# Patient Record
Sex: Female | Born: 1960 | Race: White | Hispanic: No | Marital: Married | Smoking: Never smoker
Health system: Southern US, Community
[De-identification: ages and names within clinical notes are randomized; demographics above are authoritative.]

## PROBLEM LIST (undated history)

## (undated) DIAGNOSIS — I071 Rheumatic tricuspid insufficiency: Secondary | ICD-10-CM

## (undated) DIAGNOSIS — R002 Palpitations: Secondary | ICD-10-CM

## (undated) DIAGNOSIS — I499 Cardiac arrhythmia, unspecified: Secondary | ICD-10-CM

## (undated) DIAGNOSIS — E785 Hyperlipidemia, unspecified: Secondary | ICD-10-CM

## (undated) DIAGNOSIS — I340 Nonrheumatic mitral (valve) insufficiency: Secondary | ICD-10-CM

## (undated) HISTORY — PX: ESOPHAGOGASTRODUODENOSCOPY: SHX1529

## (undated) HISTORY — PX: CHOLECYSTECTOMY: SHX55

## (undated) HISTORY — PX: HERNIA REPAIR: SHX51

## (undated) HISTORY — PX: ABDOMINAL HYSTERECTOMY: SHX81

## (undated) HISTORY — PX: COLONOSCOPY: SHX174

---

## 2006-09-04 ENCOUNTER — Other Ambulatory Visit: Payer: Self-pay

## 2006-09-04 ENCOUNTER — Emergency Department: Payer: Self-pay | Admitting: Emergency Medicine

## 2006-10-26 ENCOUNTER — Ambulatory Visit: Payer: Self-pay | Admitting: Surgery

## 2008-02-18 ENCOUNTER — Emergency Department: Payer: Self-pay | Admitting: Emergency Medicine

## 2009-08-18 ENCOUNTER — Emergency Department: Payer: Self-pay | Admitting: Emergency Medicine

## 2009-12-21 ENCOUNTER — Ambulatory Visit: Payer: Self-pay | Admitting: Unknown Physician Specialty

## 2010-02-01 ENCOUNTER — Ambulatory Visit: Payer: Self-pay | Admitting: Unknown Physician Specialty

## 2010-02-04 LAB — PATHOLOGY REPORT

## 2010-02-28 ENCOUNTER — Ambulatory Visit: Payer: Self-pay | Admitting: Obstetrics and Gynecology

## 2010-03-19 ENCOUNTER — Ambulatory Visit: Payer: Self-pay | Admitting: Obstetrics and Gynecology

## 2014-02-28 DIAGNOSIS — I471 Supraventricular tachycardia: Secondary | ICD-10-CM | POA: Insufficient documentation

## 2014-02-28 DIAGNOSIS — E785 Hyperlipidemia, unspecified: Secondary | ICD-10-CM | POA: Insufficient documentation

## 2014-02-28 DIAGNOSIS — E782 Mixed hyperlipidemia: Secondary | ICD-10-CM | POA: Insufficient documentation

## 2014-02-28 DIAGNOSIS — R002 Palpitations: Secondary | ICD-10-CM | POA: Insufficient documentation

## 2014-02-28 DIAGNOSIS — R011 Cardiac murmur, unspecified: Secondary | ICD-10-CM | POA: Insufficient documentation

## 2014-03-02 DIAGNOSIS — R001 Bradycardia, unspecified: Secondary | ICD-10-CM | POA: Insufficient documentation

## 2014-05-15 ENCOUNTER — Ambulatory Visit: Payer: Self-pay | Admitting: Family Medicine

## 2014-12-18 ENCOUNTER — Other Ambulatory Visit: Payer: Self-pay | Admitting: Family Medicine

## 2014-12-19 NOTE — Telephone Encounter (Signed)
Patient was seen in February 2016 for neck pain and chest pain-xrays were ok. These medications were prescribed. She is requesting them again-does she need appt? I thought this was acute?

## 2015-07-10 DIAGNOSIS — Z800 Family history of malignant neoplasm of digestive organs: Secondary | ICD-10-CM | POA: Insufficient documentation

## 2015-07-12 ENCOUNTER — Ambulatory Visit (INDEPENDENT_AMBULATORY_CARE_PROVIDER_SITE_OTHER): Payer: BLUE CROSS/BLUE SHIELD | Admitting: Family Medicine

## 2015-07-12 ENCOUNTER — Encounter: Payer: Self-pay | Admitting: Family Medicine

## 2015-07-12 ENCOUNTER — Ambulatory Visit
Admission: RE | Admit: 2015-07-12 | Discharge: 2015-07-12 | Disposition: A | Discharge status: Home / Self Care | Payer: BLUE CROSS/BLUE SHIELD | Source: Ambulatory Visit | Attending: Family Medicine | Admitting: Family Medicine

## 2015-07-12 VITALS — BP 102/62 | HR 70 | Temp 98.1°F | Resp 16 | Ht 68.0 in | Wt 160.0 lb

## 2015-07-12 DIAGNOSIS — M5440 Lumbago with sciatica, unspecified side: Secondary | ICD-10-CM | POA: Insufficient documentation

## 2015-07-12 DIAGNOSIS — M5137 Other intervertebral disc degeneration, lumbosacral region: Secondary | ICD-10-CM | POA: Insufficient documentation

## 2015-07-12 DIAGNOSIS — M5136 Other intervertebral disc degeneration, lumbar region: Secondary | ICD-10-CM | POA: Diagnosis not present

## 2015-07-12 MED ORDER — PREDNISONE 10 MG (21) PO TBPK: 10.0000 mg | ORAL_TABLET | Freq: Every day | ORAL | Status: DC

## 2015-07-12 NOTE — Progress Notes (Signed)
Patient ID: Marcia Farmer, female   DOB: [birth year], 55 y.o.   MRN: [id number]       Patient: Marcia Farmer Female    DOB: [birth year]   55 y.o.   MRN: [id number] Visit Date: 07/12/2015  Today's Provider: [name], MD   Chief Complaint  Patient presents with  . Back Pain    for several months.    Subjective:    HPI Patient reports that she has had pain in her lower back for several months. Patient reports that her pain is becoming more frequent and it is starting to radiate to her left leg. She denies any injury. She also describes pain as a burning sensation. She reports that she experiences the back pain everyday. It can get more intense at different times during the day.    Allergies  Allergen Reactions  . Nitrofurantoin Rash   Previous Medications   CYCLOBENZAPRINE (FLEXERIL) 10 MG TABLET    TAKE ONE TABLET BY MOUTH AT BEDTIME AS NEEDED FOR PAIN   NAPROXEN (NAPROSYN) 500 MG TABLET    TAKE ONE TABLET BY MOUTH TWICE DAILY AS NEEDED FOR PAIN    Review of Systems  Constitutional: Negative.   Respiratory: Negative.   Musculoskeletal: Positive for myalgias and back pain. Negative for joint swelling, arthralgias, gait problem, neck pain and neck stiffness.  Neurological: Negative.     Social History  Substance Use Topics  . Smoking status: Never Smoker   . Smokeless tobacco: Not on file  . Alcohol Use: Not on file   Objective:   BP 102/62 mmHg  Pulse 70  Temp(Src) 98.1 F (36.7 C)  Resp 16  Ht 5\' 8"  (1.727 m)  Wt 160 lb (72.576 kg)  BMI 24.33 kg/m2  Physical Exam  Constitutional: She appears well-developed and well-nourished.  Musculoskeletal: She exhibits tenderness. She exhibits no edema.  Tender over S1-S2. Decreased sensation in left foot. Tender on left side of buttocks.   Left sciatic notch.      Assessment & Plan:     1. Midline low back pain with sciatica, sciatica laterality unspecified  - predniSONE (STERAPRED UNI-PAK 21 TAB)  10 MG (21) TBPK tablet; Take 1 tablet (10 mg total) by mouth daily. Take 6 tablets X 1 day, 5 tablet X 1 day, 4 tablets X 1 day, 3 tablets X 1 day, 2 tablets X 1 day, 1 tablet X 1 day.  Dispense: 21 tablet; Refill: 0 - DG Lumbar Spine Complete; Future       Kenji Mapel Cranford Mon, MD  [facility]

## 2015-07-13 ENCOUNTER — Telehealth: Payer: Self-pay

## 2015-07-13 NOTE — Telephone Encounter (Signed)
Advised patient of results.  

## 2015-07-13 NOTE — Telephone Encounter (Signed)
Pt returned call. Thanks TNP °

## 2015-07-13 NOTE — Telephone Encounter (Signed)
Tried calling patient and no answer. Unable to leave VM due to mailbox being full. Will try again later.

## 2015-07-13 NOTE — Telephone Encounter (Signed)
-----   Message from Jerrol Banana., MD sent at 07/12/2015  4:01 PM EDT ----- Arthritis and DDD present

## 2015-07-25 ENCOUNTER — Encounter: Payer: Self-pay | Admitting: Family Medicine

## 2015-07-25 ENCOUNTER — Ambulatory Visit (INDEPENDENT_AMBULATORY_CARE_PROVIDER_SITE_OTHER): Payer: BLUE CROSS/BLUE SHIELD | Admitting: Family Medicine

## 2015-07-25 VITALS — BP 102/60 | Temp 98.0°F | Resp 16 | Wt 158.0 lb

## 2015-07-25 DIAGNOSIS — M549 Dorsalgia, unspecified: Secondary | ICD-10-CM

## 2015-07-25 NOTE — Progress Notes (Signed)
Patient ID: Marcia Farmer, female   DOB: [birth year], 55 y.o.   MRN: [id number]       Patient: Marcia Farmer Female    DOB: 1960-05-08   55 y.o.   MRN: [id number] Visit Date: 07/25/2015  Today's Provider: [name], MD   Chief Complaint  Patient presents with  . Back Pain    2 week FU   Subjective:    HPI Patient comes in today for a follow up on her back pain. Patient was started on prednisone and reports that her back pain has improved. Her xray showed degenerative disk disease in the areas in which she was experiencing pain.     Allergies  Allergen Reactions  . Nitrofurantoin Rash   Previous Medications   CYCLOBENZAPRINE (FLEXERIL) 10 MG TABLET    TAKE ONE TABLET BY MOUTH AT BEDTIME AS NEEDED FOR PAIN   NAPROXEN (NAPROSYN) 500 MG TABLET    TAKE ONE TABLET BY MOUTH TWICE DAILY AS NEEDED FOR PAIN   PREDNISONE (STERAPRED UNI-PAK 21 TAB) 10 MG (21) TBPK TABLET    Take 1 tablet (10 mg total) by mouth daily. Take 6 tablets X 1 day, 5 tablet X 1 day, 4 tablets X 1 day, 3 tablets X 1 day, 2 tablets X 1 day, 1 tablet X 1 day.    Review of Systems  Constitutional: Negative.   HENT: Negative.   Respiratory: Negative.   Endocrine: Negative.   Musculoskeletal: Positive for back pain. Negative for myalgias, joint swelling, arthralgias, gait problem and neck pain.  Allergic/Immunologic: Negative.   Neurological: Negative.     Social History  Substance Use Topics  . Smoking status: Never Smoker   . Smokeless tobacco: Not on file  . Alcohol Use: 0.0 oz/week    0 Standard drinks or equivalent per week     Comment: 7-9oz of wine daily   Objective:   BP 102/60 mmHg  Temp(Src) 98 F (36.7 C)  Resp 16  Wt 158 lb (71.668 kg)  Physical Exam  Constitutional: She is oriented to person, place, and time. She appears well-developed and well-nourished.  HENT:  Head: Normocephalic and atraumatic.  Right Ear: External ear normal.  Left Ear: External ear normal.    Nose: Nose normal.  Eyes: Conjunctivae are normal.  Neck: Thyromegaly present.  Cardiovascular: Normal rate, regular rhythm and normal heart sounds.   Pulmonary/Chest: Effort normal and breath sounds normal.  Abdominal: Soft.  Musculoskeletal: Normal range of motion.  Neurological: She is alert and oriented to person, place, and time. She displays normal reflexes. No cranial nerve deficit. She exhibits normal muscle tone. Coordination normal.  Skin: Skin is warm and dry.  Psychiatric: She has a normal mood and affect. Her behavior is normal. Judgment and thought content normal.        Assessment & Plan:     1. Mid back pain Improved. Will refer to physical therapy to help improve back pain.  - Ambulatory referral to Physical Therapy  2. Adenomatous polyps of the colon  I have not seen the report but it must be high-grade dysplasia. This is to be repeated in the next few months with colonoscopy.       Tayna Smethurst Cranford Mon, MD  [facility]

## 2015-08-21 ENCOUNTER — Ambulatory Visit: Payer: BLUE CROSS/BLUE SHIELD | Admitting: Physical Therapy

## 2015-12-11 ENCOUNTER — Emergency Department: Payer: BLUE CROSS/BLUE SHIELD

## 2015-12-11 ENCOUNTER — Telehealth: Payer: Self-pay

## 2015-12-11 ENCOUNTER — Encounter: Payer: Self-pay | Admitting: Medical Oncology

## 2015-12-11 ENCOUNTER — Emergency Department
Admission: EM | Admit: 2015-12-11 | Discharge: 2015-12-11 | Disposition: A | Discharge status: Home / Self Care | Payer: BLUE CROSS/BLUE SHIELD | Attending: Emergency Medicine | Admitting: Emergency Medicine

## 2015-12-11 DIAGNOSIS — Z791 Long term (current) use of non-steroidal anti-inflammatories (NSAID): Secondary | ICD-10-CM | POA: Diagnosis not present

## 2015-12-11 DIAGNOSIS — R002 Palpitations: Secondary | ICD-10-CM | POA: Insufficient documentation

## 2015-12-11 LAB — BASIC METABOLIC PANEL
ANION GAP: 5 (ref 5–15)
BUN: 14 mg/dL (ref 6–20)
CHLORIDE: 100 mmol/L — AB (ref 101–111)
CO2: 31 mmol/L (ref 22–32)
Calcium: 9.8 mg/dL (ref 8.9–10.3)
Creatinine, Ser: 0.82 mg/dL (ref 0.44–1.00)
GFR calc Af Amer: >60 mL/min (ref >60)
GFR calc non Af Amer: >60 mL/min (ref >60)
GLUCOSE: 103 mg/dL — AB (ref 65–99)
POTASSIUM: 4.2 mmol/L (ref 3.5–5.1)
Sodium: 136 mmol/L (ref 135–145)

## 2015-12-11 LAB — URINALYSIS COMPLETE WITH MICROSCOPIC (ARMC ONLY)
BACTERIA UA: NONE SEEN
Bilirubin Urine: NEGATIVE
Glucose, UA: NEGATIVE mg/dL
Hgb urine dipstick: NEGATIVE
Ketones, ur: NEGATIVE mg/dL
Leukocytes, UA: NEGATIVE
NITRITE: NEGATIVE
PH: 8 (ref 5.0–8.0)
PROTEIN: NEGATIVE mg/dL
SPECIFIC GRAVITY, URINE: 1.011 (ref 1.005–1.030)

## 2015-12-11 LAB — CBC
HEMATOCRIT: 41.5 % (ref 35.0–47.0)
HEMOGLOBIN: 14.8 g/dL (ref 12.0–16.0)
MCH: 31.8 pg (ref 26.0–34.0)
MCHC: 35.7 g/dL (ref 32.0–36.0)
MCV: 89.1 fL (ref 80.0–100.0)
Platelets: 251 10*3/uL (ref 150–440)
RBC: 4.66 MIL/uL (ref 3.80–5.20)
RDW: 12.6 % (ref 11.5–14.5)
WBC: 6.7 10*3/uL (ref 3.6–11.0)

## 2015-12-11 LAB — TSH: TSH: 2.091 u[IU]/mL (ref 0.350–4.500)

## 2015-12-11 LAB — TROPONIN I: Troponin I: <0.03 ng/mL (ref <0.03)

## 2015-12-11 NOTE — ED Provider Notes (Signed)
[facility] Emergency Department Provider Note  ____________________________________________  Time seen: Approximately 3:10 PM  I have reviewed the triage vital signs and the nursing notes.   HISTORY  Chief Complaint Tachycardia and Anxiety   HPI Marcia Farmer is a 55 y.o. female a history of paroxysmal supraventricular tachycardia who presents for evaluation of palpitations. Patient reports that she usually had an episode of symptomatic palpitations once a month. She was initially seen by Dr. Madilyn Hook 2015 with a negative stress test and a Holter monitor that showed atrial tachycardia. The patient is not on any medications. She reports that over the course of the last 2 weeks she has been having these episodes for most of the day every day. She reports that sometimes they get so intense that she feels like she is going to pass out. She has no chest pain, no shortness of breath, no diaphoresis, no nausea or vomiting associated with this pain. She is not a smoker. She has family history of ischemic Farmer disease in her paternal side. She denies any personal or family history of blood clots, recent travel or immobilization, leg pain or swelling, exogenous hormones, hemoptysis. She drinks 2 cups of caffeine a day and one glass of wine a day. She denies drug use. Currently her symptoms are very mild and she describes them as a fluttering butterfly sensation on her chest  History reviewed. No pertinent past medical history.  Patient Active Problem List   Diagnosis Date Noted  . Family history of colon cancer 07/10/2015  . Bradycardia 03/02/2014  . Cardiac murmur 02/28/2014  . Combined fat and carbohydrate induced hyperlipemia 02/28/2014  . Paroxysmal supraventricular tachycardia ([place]) 02/28/2014  . Cholelithiasis and cholecystitis without obstruction 10/05/2006    Past Surgical History:  Procedure Laterality Date  . ABDOMINAL HYSTERECTOMY    .  CHOLECYSTECTOMY      Prior to Admission medications   Medication Sig Start Date End Date Taking? Authorizing Provider  cyclobenzaprine (FLEXERIL) 10 MG tablet TAKE ONE TABLET BY MOUTH AT BEDTIME AS NEEDED FOR PAIN 12/20/14   Jerrol Banana., MD  naproxen (NAPROSYN) 500 MG tablet TAKE ONE TABLET BY MOUTH TWICE DAILY AS NEEDED FOR PAIN 12/20/14   Jerrol Banana., MD  predniSONE (STERAPRED UNI-PAK 21 TAB) 10 MG (21) TBPK tablet Take 1 tablet (10 mg total) by mouth daily. Take 6 tablets X 1 day, 5 tablet X 1 day, 4 tablets X 1 day, 3 tablets X 1 day, 2 tablets X 1 day, 1 tablet X 1 day. Patient not taking: Reported on 07/25/2015 07/12/15   Jerrol Banana., MD    Allergies Nitrofurantoin  Family History  Problem Relation Age of Onset  . Farmer disease Mother   . Diabetes Father   . Farmer disease Father   . Colon cancer Father     Social History Social History  Substance Use Topics  . Smoking status: Never Smoker  . Smokeless tobacco: Not on file  . Alcohol use 0.0 oz/week     Comment: 7-9oz of wine daily    Review of Systems Constitutional: Negative for fever. Eyes: Negative for visual changes. ENT: Negative for sore throat. Cardiovascular: Negative for chest pain. + palpitations Respiratory: Negative for shortness of breath. Gastrointestinal: Negative for abdominal pain, vomiting or diarrhea. Genitourinary: Negative for dysuria. Musculoskeletal: Negative for back pain. Skin: Negative for rash. Neurological: Negative for headaches, weakness or numbness.  ____________________________________________   PHYSICAL EXAM:  VITAL SIGNS: ED Triage  Vitals [12/11/15 1322]  Enc Vitals Group     BP (!) 152/85     Pulse Rate 76     Resp 18     Temp 98.1 F (36.7 C)     Temp Source Oral     SpO2 100 %     Weight 155 lb (70.3 kg)     Height 5\' 8"  (1.727 m)     Head Circumference      Peak Flow      Pain Score      Pain Loc      Pain Edu?      Excl. in [place]?      Constitutional: Alert and oriented. Well appearing and in no apparent distress. HEENT:      Head: Normocephalic and atraumatic.         Eyes: Conjunctivae are normal. Sclera is non-icteric. EOMI. PERRL      Mouth/Throat: Mucous membranes are moist.       Neck: Supple with no signs of meningismus. Cardiovascular: Regular rate and rhythm. No murmurs, gallops, or rubs. 2+ symmetrical distal pulses are present in all extremities. No JVD. Respiratory: Normal respiratory effort. Lungs are clear to auscultation bilaterally. No wheezes, crackles, or rhonchi.  Gastrointestinal: Soft, non tender, and non distended with positive bowel sounds. No rebound or guarding. Genitourinary: No CVA tenderness. Musculoskeletal: Nontender with normal range of motion in all extremities. No edema, cyanosis, or erythema of extremities. Neurologic: Normal speech and language. Face is symmetric. Moving all extremities. No gross focal neurologic deficits are appreciated. Skin: Skin is warm, dry and intact. No rash noted. Psychiatric: Mood and affect are normal. Speech and behavior are normal.  ____________________________________________   LABS (all labs ordered are listed, but only abnormal results are displayed)  Labs Reviewed  BASIC METABOLIC PANEL - Abnormal; Notable for the following:       Result Value   Chloride 100 (*)    Glucose, Bld 103 (*)    All other components within normal limits  URINALYSIS COMPLETEWITH MICROSCOPIC ([facility] ONLY) - Abnormal; Notable for the following:    Color, Urine STRAW (*)    APPearance CLEAR (*)    Squamous Epithelial / LPF 0-5 (*)    All other components within normal limits  CBC  TROPONIN I  TSH  TROPONIN I  CBG MONITORING, ED   ____________________________________________  EKG  ED ECG REPORT I, Rudene Re, the attending physician, personally viewed and interpreted this ECG. Normal sinus rhythm, rate of 81, normal intervals, normal axis, no ST  elevations or depressions. Unchanged from prior ____________________________________________  RADIOLOGY  CXR: Negative ____________________________________________   PROCEDURES  Procedure(s) performed: None Procedures Critical Care performed:  None ____________________________________________   INITIAL IMPRESSION / ASSESSMENT AND PLAN / ED COURSE  56 y.o. female a history of paroxysmal supraventricular tachycardia who presents for evaluation of palpitations. EKG with no evidence of SVT. Physical exam was within normal limits. Patient had Holter monitor in 2015 which showed atrial tachycardia. Will check TSH, CBC, BMP, troponin as patient has fh of ischemic Farmer disease. Will obs on telemetry. Will discuss patient with Dr. Nehemiah Massed.  Clinical Course  Comment By Time  Spoke with Dr. Saralyn Pilar who was able to look at the results of patient's holter monitor from 2015 which shoed no signs of arrhythmia. He recommended against starting patient on BB at this time and have patient see Dr. Nehemiah Massed this week for close follow up. 2nd troponin is due now. Rudene Re, MD 09/26  1629  Patient watched on telemetry with occasional PVCs but no evidence of arrhythmia. Blood work within normal limits including troponin 2. We'll discharge patient at this time with close follow-up with Dr. Nehemiah Massed. I discussed return precautions with patient and her husband were both comfortable with the plan. Rudene Re, MD 09/26 1751    Pertinent labs & imaging results that were available during my care of the patient were reviewed by me and considered in my medical decision making (see chart for details).    ____________________________________________   FINAL CLINICAL IMPRESSION(S) / ED DIAGNOSES  Final diagnoses:  Palpitations      NEW MEDICATIONS STARTED DURING THIS VISIT:  Discharge Medication List as of 12/11/2015  5:52 PM       Note:  This document was prepared using Dragon voice  recognition software and may include unintentional dictation errors.    Rudene Re, MD 12/11/15 2011

## 2015-12-11 NOTE — ED Triage Notes (Signed)
Pt reports for the past 2 weeks she has been feeling anxious and tearful for no reason. Pt reports that she feels her heart racing right now, denies chest pain but feels light headed.

## 2015-12-11 NOTE — Telephone Encounter (Signed)
Patient called office very tearful on phone stating that she has been more aware of her heart beat over the past several days. Patient states the irregularity is constant and has got worse over the past few days. Patient denied shortness of breath, increased stress or anxiety, dizziness, fever, URI symptoms or paresthesia. Patient reports that she has been feeling light headed over the past several days and today it has increased. Patient was sitting down when I was speaking to her on phone, she states that she has a history of double heart beat. I spoke with phone team who stated that neither Dr. Rosanna Randy or P.A's had openings this evening. I instructed patient that it would be best that she seek immediate attention rather than to prolong and wait, I advised patient to go to Emergency room. Patient seemed to be compliant with seeking immediate medical attention. Patient would like nurse to give her a call back so she can give a update in regards to her condition. Patient can be reached at [phone].KW

## 2015-12-11 NOTE — ED Notes (Addendum)
Pt reports that she is feeling better. Denies pain. Denies fluttering in chest.

## 2015-12-12 ENCOUNTER — Ambulatory Visit: Payer: BLUE CROSS/BLUE SHIELD | Admitting: Family Medicine

## 2015-12-12 DIAGNOSIS — Z800 Family history of malignant neoplasm of digestive organs: Secondary | ICD-10-CM | POA: Diagnosis not present

## 2015-12-12 DIAGNOSIS — E782 Mixed hyperlipidemia: Secondary | ICD-10-CM | POA: Diagnosis not present

## 2015-12-12 DIAGNOSIS — R002 Palpitations: Secondary | ICD-10-CM | POA: Diagnosis not present

## 2015-12-12 DIAGNOSIS — Z86010 Personal history of colonic polyps: Secondary | ICD-10-CM | POA: Diagnosis not present

## 2015-12-12 DIAGNOSIS — I471 Supraventricular tachycardia: Secondary | ICD-10-CM | POA: Diagnosis not present

## 2015-12-12 NOTE — Telephone Encounter (Signed)
Pt has appt still on the schedule for 11:15 am will wait and see if she comes in.-aa

## 2015-12-13 NOTE — Telephone Encounter (Signed)
FYI  Spoke with patient. She went to ER and has followed up with Dr Nehemiah Massed yesterday September 27th, she is wearing a holter monitor and is set up for echocardiogram on October 6th. Advised patient if she needs anything from [place] to let us know.-aa

## 2015-12-19 ENCOUNTER — Encounter: Payer: Self-pay | Admitting: Family Medicine

## 2015-12-19 ENCOUNTER — Ambulatory Visit (INDEPENDENT_AMBULATORY_CARE_PROVIDER_SITE_OTHER): Payer: BLUE CROSS/BLUE SHIELD | Admitting: Family Medicine

## 2015-12-19 VITALS — BP 126/72 | HR 90 | Temp 97.9°F | Resp 18 | Wt 163.0 lb

## 2015-12-19 DIAGNOSIS — F419 Anxiety disorder, unspecified: Secondary | ICD-10-CM

## 2015-12-19 DIAGNOSIS — R002 Palpitations: Secondary | ICD-10-CM | POA: Diagnosis not present

## 2015-12-19 MED ORDER — ALPRAZOLAM 0.25 MG PO TABS: 0.2500 mg | ORAL_TABLET | Freq: Three times a day (TID) | ORAL | 5 refills | Status: AC | PRN

## 2015-12-19 MED ORDER — SERTRALINE HCL 50 MG PO TABS: 50.0000 mg | ORAL_TABLET | Freq: Every day | ORAL | 3 refills | Status: DC

## 2015-12-19 NOTE — Progress Notes (Signed)
Marcia Farmer  MRN: [id number] DOB: Jan 04, 1961  Subjective:  HPI   The patient is a 55 year old female who presents after being seen in the ED for elevated heart rate.  She has had a 24 hour holter done and Dr Salena Saner is seeing her to do Echo on Friday 06, 2017. The patient was not treated with any kind of medication but feels she may need something to calm her down. The patient expressed that she is overwhelmed with work and family responsibilities.  She states she is not depressed and does not want to be on an antidepressant.   Patient Active Problem List   Diagnosis Date Noted  . Family history of colon cancer 07/10/2015  . Bradycardia 03/02/2014  . Cardiac murmur 02/28/2014  . Combined fat and carbohydrate induced hyperlipemia 02/28/2014  . Paroxysmal supraventricular tachycardia ([place]) 02/28/2014  . Cholelithiasis and cholecystitis without obstruction 10/05/2006    No past medical history on file.  Social History   Social History  . Marital status: Married    Spouse name: N/A  . Number of children: N/A  . Years of education: N/A   Occupational History  . Not on file.   Social History Main Topics  . Smoking status: Never Smoker  . Smokeless tobacco: Not on file  . Alcohol use 0.0 oz/week     Comment: 7-9oz of wine daily  . Drug use: No  . Sexual activity: Not on file   Other Topics Concern  . Not on file   Social History Narrative  . No narrative on file    Outpatient Encounter Prescriptions as of 12/19/2015  Medication Sig  . cyclobenzaprine (FLEXERIL) 10 MG tablet TAKE ONE TABLET BY MOUTH AT BEDTIME AS NEEDED FOR PAIN  . naproxen (NAPROSYN) 500 MG tablet TAKE ONE TABLET BY MOUTH TWICE DAILY AS NEEDED FOR PAIN  . [DISCONTINUED] predniSONE (STERAPRED UNI-PAK 21 TAB) 10 MG (21) TBPK tablet Take 1 tablet (10 mg total) by mouth daily. Take 6 tablets X 1 day, 5 tablet X 1 day, 4 tablets X 1 day, 3 tablets X 1 day, 2 tablets X 1 day, 1 tablet X 1 day.  (Patient not taking: Reported on 07/25/2015)   No facility-administered encounter medications on file as of 12/19/2015.     Allergies  Allergen Reactions  . Nitrofurantoin Rash    Review of Systems  Constitutional: Negative for fever and malaise/fatigue.  Respiratory: Negative for cough, shortness of breath and wheezing.   Cardiovascular: Negative for chest pain, palpitations, orthopnea, claudication, leg swelling and PND.  Neurological: Positive for dizziness. Negative for weakness and headaches.  Psychiatric/Behavioral: Positive for memory loss. Negative for depression, hallucinations, substance abuse and suicidal ideas. The patient is nervous/anxious. The patient does not have insomnia.    Objective:  BP 126/72   Pulse 90   Temp 97.9 F (36.6 C) (Oral)   Resp 18   Wt 163 lb (73.9 kg)   BMI 24.78 kg/m   Physical Exam  Constitutional: She is well-developed, well-nourished, and in no distress.  HENT:  Head: Normocephalic and atraumatic.  Eyes: Pupils are equal, round, and reactive to light.  Neck: Normal range of motion.  Cardiovascular: Normal rate, regular rhythm and normal heart sounds.   Pulmonary/Chest: Effort normal and breath sounds normal.    Assessment and Plan :   1. Acute anxiety/chronic PHQ9 on Next visit as there is some superimposed mild depression. She is certainly not suicidal or homicidal. - sertraline (ZOLOFT) 50 MG  tablet; Take 1 tablet (50 mg total) by mouth daily.  Dispense: 30 tablet; Refill: 3 - ALPRAZolam (XANAX) 0.25 MG tablet; Take 1 tablet (0.25 mg total) by mouth every 8 (eight) hours as needed for anxiety.  Dispense: 90 tablet; Refill: 5 I have done the exam and reviewed the chart and it is accurate to the best of my knowledge. Miguel Aschoff M.D. [facility]    HPI, Exam and A&P Transcribed under the direction and in the presence of [name]., MD. Electronically Signed: Althea Charon,  [place]

## 2015-12-21 DIAGNOSIS — I471 Supraventricular tachycardia: Secondary | ICD-10-CM | POA: Diagnosis not present

## 2016-01-08 ENCOUNTER — Other Ambulatory Visit: Payer: Self-pay | Admitting: Family Medicine

## 2016-01-09 NOTE — Telephone Encounter (Signed)
LOV 12/19/2015. Emily Drozdowski, CMA  

## 2016-01-10 DIAGNOSIS — I493 Ventricular premature depolarization: Secondary | ICD-10-CM | POA: Diagnosis not present

## 2016-01-23 ENCOUNTER — Encounter: Payer: Self-pay | Admitting: Family Medicine

## 2016-01-23 ENCOUNTER — Ambulatory Visit (INDEPENDENT_AMBULATORY_CARE_PROVIDER_SITE_OTHER): Payer: BLUE CROSS/BLUE SHIELD | Admitting: Family Medicine

## 2016-01-23 VITALS — BP 118/64 | HR 84 | Temp 97.9°F | Resp 16 | Ht 68.0 in | Wt 168.4 lb

## 2016-01-23 DIAGNOSIS — Z0000 Encounter for general adult medical examination without abnormal findings: Secondary | ICD-10-CM

## 2016-01-23 DIAGNOSIS — E782 Mixed hyperlipidemia: Secondary | ICD-10-CM | POA: Diagnosis not present

## 2016-01-23 LAB — POCT URINALYSIS DIPSTICK
BILIRUBIN UA: NEGATIVE
GLUCOSE UA: NEGATIVE
KETONES UA: NEGATIVE
Leukocytes, UA: NEGATIVE
NITRITE UA: NEGATIVE
PH UA: 6.5
Protein, UA: NEGATIVE
RBC UA: NEGATIVE
SPEC GRAV UA: 1.02
Urobilinogen, UA: 0.2

## 2016-01-23 NOTE — Progress Notes (Signed)
Patient: Marcia Farmer, Female    DOB: [birth year], 55 y.o.   MRN: [id number] Visit Date: 01/23/2016  Today's Provider: [name], MD   Chief Complaint  Patient presents with  . Annual Exam   Subjective:    Annual physical exam Marcia Farmer is a 55 y.o. female who presents today for health maintenance and complete physical. She feels fairly well. She reports exercising slightly. She reports she is sleeping well.  ----------------------------------------------------------------- Colonoscopy: 02/27/2015.Patient is scheduled for a repeat in December Pap: [facility] Mammo: [facility] in 2016  Review of Systems  Constitutional: Negative.   HENT: Negative.   Eyes: Negative.   Respiratory: Negative.   Cardiovascular: Negative.   Gastrointestinal: Negative.   Endocrine: Negative.   Genitourinary: Negative.   Musculoskeletal: Positive for arthralgias and back pain.  Skin: Negative.   Allergic/Immunologic: Negative.   Neurological: Negative.   Hematological: Negative.   Psychiatric/Behavioral: Negative.     Social History      She  reports that she has never smoked. She does not have any smokeless tobacco history on file. She reports that she drinks about 6.0 - 8.4 oz of alcohol per week . She reports that she does not use drugs.       Social History   Social History  . Marital status: Married    Spouse name: N/A  . Number of children: N/A  . Years of education: N/A   Social History Main Topics  . Smoking status: Never Smoker  . Smokeless tobacco: None  . Alcohol use 6.0 - 8.4 oz/week    10 - 14 Glasses of wine per week     Comment: 7-9oz of wine daily  . Drug use: No  . Sexual activity: Not Asked   Other Topics Concern  . None   Social History Narrative  . None    No past medical history on file.   Patient Active Problem List   Diagnosis Date Noted  . 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 Family history of colon cancer  07/10/2015  . Bradycardia 03/02/2014  . Cardiac murmur 02/28/2014  . Combined fat and carbohydrate induced hyperlipemia 02/28/2014  . Paroxysmal supraventricular tachycardia ([place]) 02/28/2014  . Cholelithiasis and cholecystitis without obstruction 10/05/2006    Past Surgical History:  Procedure Laterality Date  . ABDOMINAL HYSTERECTOMY    . CHOLECYSTECTOMY      Family History        Family Status  Relation Status  . Mother Alive  . Father Deceased at age 19  . Sister Alive        Her family history includes Colon cancer in her father; Diabetes in her father; Heart disease in her father and mother.     Allergies  Allergen Reactions  . Nitrofurantoin Rash     Current Outpatient Prescriptions:  .  ALPRAZolam (XANAX) 0.25 MG tablet, Take 1 tablet (0.25 mg total) by mouth every 8 (eight) hours as needed for anxiety., Disp: 90 tablet, Rfl: 5 .  cyclobenzaprine (FLEXERIL) 10 MG tablet, TAKE ONE TABLET BY MOUTH AT BEDTIME AS NEEDED FOR PAIN, Disp: 30 tablet, Rfl: 5 .  naproxen (NAPROSYN) 500 MG tablet, TAKE ONE TABLET BY MOUTH TWICE DAILY AS NEEDED FOR PAIN, Disp: 60 tablet, Rfl: 0 .  sertraline (ZOLOFT) 50 MG tablet, Take 1 tablet (50 mg total) by mouth daily. (Patient not taking: Reported on 01/23/2016), Disp: 30 tablet, Rfl: 3   Patient Care Team: Eulas Post  Brooke Bonito., MD as PCP - General (Family Medicine)      Objective:   Vitals: BP 118/64 (BP Location: Right Arm, Patient Position: Sitting, Cuff Size: Normal)   Pulse 84   Temp 97.9 F (36.6 C) (Oral)   Resp 16   Ht 5\' 8"  (1.727 m)   Wt 168 lb 6.4 oz (76.4 kg)   BMI 25.61 kg/m    Physical Exam  Constitutional: She is oriented to person, place, and time. She appears well-developed and well-nourished.  HENT:  Head: Normocephalic and atraumatic.  Right Ear: External ear normal.  Left Ear: External ear normal.  Mouth/Throat: Oropharynx is clear and moist.  Eyes: Conjunctivae and EOM are normal. Pupils are equal,  round, and reactive to light.  Neck: Normal range of motion. Neck supple.  Cardiovascular: Normal rate, regular rhythm, normal heart sounds and intact distal pulses.   Pulmonary/Chest: Effort normal and breath sounds normal.  Abdominal: Soft. Bowel sounds are normal.  Musculoskeletal: Normal range of motion.  Neurological: She is alert and oriented to person, place, and time. She has normal reflexes.  Skin: Skin is warm and dry.  Psychiatric: She has a normal mood and affect. Her behavior is normal. Judgment and thought content normal.     Depression Screen No flowsheet data found.    Assessment & Plan:     Routine Health Maintenance and Physical Exam  Exercise Activities and Dietary recommendations Goals    None       There is no immunization history on file for this patient.  Health Maintenance  Topic Date Due  . Hepatitis C Screening  09/25/1962  . HIV Screening  01/12/1978  . TETANUS/TDAP  01/12/1982  . PAP SMEAR  01/13/1984  . MAMMOGRAM  01/12/2013  . INFLUENZA VACCINE  01/22/2017 (Originally 10/16/2015)  . COLONOSCOPY  02/26/2025   [name]. Breast Pap and rectal per gynecology. Discussed health benefits of physical activity, and encouraged her to engage in regular exercise appropriate for her age and condition.  I will see her back by the end of the year in follow-up for adjustment reaction.   --------------------------------------------------------------------   I have done the exam and reviewed the above chart and it is accurate to the best of my knowledge. Development worker, community has been used in this note in any air is in the dictation or transcription are unintentional.  [name], MD  [place]

## 2016-02-11 DIAGNOSIS — Z0000 Encounter for general adult medical examination without abnormal findings: Secondary | ICD-10-CM | POA: Diagnosis not present

## 2016-02-11 DIAGNOSIS — E782 Mixed hyperlipidemia: Secondary | ICD-10-CM | POA: Diagnosis not present

## 2016-02-12 ENCOUNTER — Telehealth: Payer: Self-pay

## 2016-02-12 LAB — HEPATIC FUNCTION PANEL
ALK PHOS: 94 IU/L (ref 39–117)
ALT: 20 IU/L (ref 0–32)
AST: 19 IU/L (ref 0–40)
Albumin: 5 g/dL (ref 3.5–5.5)
Bilirubin Total: 0.4 mg/dL (ref 0.0–1.2)
Bilirubin, Direct: 0.1 mg/dL (ref 0.00–0.40)
Total Protein: 7.8 g/dL (ref 6.0–8.5)

## 2016-02-12 LAB — LIPID PANEL
CHOLESTEROL TOTAL: 268 mg/dL — AB (ref 100–199)
Chol/HDL Ratio: 4.2 ratio units (ref 0.0–4.4)
HDL: 64 mg/dL (ref >39)
LDL Calculated: 174 mg/dL — ABNORMAL HIGH (ref 0–99)
TRIGLYCERIDES: 151 mg/dL — AB (ref 0–149)
VLDL Cholesterol Cal: 30 mg/dL (ref 5–40)

## 2016-02-12 NOTE — Telephone Encounter (Signed)
-----   Message from Jerrol Banana., MD sent at 02/12/2016  8:14 AM EST ----- Labs okay. Cholesterol  moderately elevated.

## 2016-02-12 NOTE — Telephone Encounter (Signed)
[id number] 

## 2016-02-12 NOTE — Telephone Encounter (Signed)
Patient was advised. KW 

## 2016-03-03 ENCOUNTER — Encounter: Payer: Self-pay | Admitting: *Deleted

## 2016-03-03 ENCOUNTER — Encounter
Admission: RE | Disposition: A | Discharge status: Home / Self Care | Payer: Self-pay | Source: Ambulatory Visit | Attending: Unknown Physician Specialty

## 2016-03-03 ENCOUNTER — Ambulatory Visit
Admission: RE | Admit: 2016-03-03 | Discharge: 2016-03-03 | Disposition: A | Discharge status: Home / Self Care | Payer: BLUE CROSS/BLUE SHIELD | Source: Ambulatory Visit | Attending: Unknown Physician Specialty | Admitting: Unknown Physician Specialty

## 2016-03-03 ENCOUNTER — Ambulatory Visit: Payer: BLUE CROSS/BLUE SHIELD | Admitting: Anesthesiology

## 2016-03-03 DIAGNOSIS — E785 Hyperlipidemia, unspecified: Secondary | ICD-10-CM | POA: Insufficient documentation

## 2016-03-03 DIAGNOSIS — Z1211 Encounter for screening for malignant neoplasm of colon: Secondary | ICD-10-CM | POA: Diagnosis not present

## 2016-03-03 DIAGNOSIS — D123 Benign neoplasm of transverse colon: Secondary | ICD-10-CM | POA: Insufficient documentation

## 2016-03-03 DIAGNOSIS — Z86010 Personal history of colonic polyps: Secondary | ICD-10-CM | POA: Diagnosis not present

## 2016-03-03 DIAGNOSIS — D122 Benign neoplasm of ascending colon: Secondary | ICD-10-CM | POA: Diagnosis not present

## 2016-03-03 DIAGNOSIS — K648 Other hemorrhoids: Secondary | ICD-10-CM | POA: Diagnosis not present

## 2016-03-03 DIAGNOSIS — K640 First degree hemorrhoids: Secondary | ICD-10-CM | POA: Diagnosis not present

## 2016-03-03 DIAGNOSIS — K635 Polyp of colon: Secondary | ICD-10-CM | POA: Diagnosis not present

## 2016-03-03 DIAGNOSIS — K649 Unspecified hemorrhoids: Secondary | ICD-10-CM | POA: Diagnosis not present

## 2016-03-03 DIAGNOSIS — I472 Ventricular tachycardia: Secondary | ICD-10-CM | POA: Diagnosis not present

## 2016-03-03 HISTORY — PX: COLONOSCOPY WITH PROPOFOL: SHX5780

## 2016-03-03 HISTORY — DX: Rheumatic tricuspid insufficiency: I07.1

## 2016-03-03 HISTORY — DX: Palpitations: R00.2

## 2016-03-03 HISTORY — DX: Cardiac arrhythmia, unspecified: I49.9

## 2016-03-03 HISTORY — DX: Hyperlipidemia, unspecified: E78.5

## 2016-03-03 HISTORY — DX: Nonrheumatic mitral (valve) insufficiency: I34.0

## 2016-03-03 SURGERY — COLONOSCOPY WITH PROPOFOL
Anesthesia: General

## 2016-03-03 MED ORDER — FENTANYL CITRATE (PF) 100 MCG/2ML IJ SOLN
INTRAMUSCULAR | Status: DC | PRN
  Administered 2016-03-03: 50 ug via INTRAVENOUS

## 2016-03-03 MED ORDER — SODIUM CHLORIDE 0.9 % IV SOLN: INTRAVENOUS | Status: DC

## 2016-03-03 MED ORDER — PROPOFOL 500 MG/50ML IV EMUL
INTRAVENOUS | Status: DC | PRN
  Administered 2016-03-03: 100 ug/kg/min via INTRAVENOUS

## 2016-03-03 MED ORDER — SODIUM CHLORIDE 0.9 % IV SOLN
INTRAVENOUS | Status: DC
  Administered 2016-03-03: 1000 mL via INTRAVENOUS

## 2016-03-03 MED ORDER — MIDAZOLAM HCL 5 MG/5ML IJ SOLN
INTRAMUSCULAR | Status: DC | PRN
Start: 2016-03-03 — End: 2016-03-03
  Administered 2016-03-03: 1 mg via INTRAVENOUS

## 2016-03-03 MED ORDER — LIDOCAINE HCL (PF) 2 % IJ SOLN
INTRAMUSCULAR | Status: DC | PRN
  Administered 2016-03-03: 50 mg

## 2016-03-03 MED ORDER — PROPOFOL 10 MG/ML IV BOLUS
INTRAVENOUS | Status: DC | PRN
Start: 2016-03-03 — End: 2016-03-03
  Administered 2016-03-03: 50 mg via INTRAVENOUS

## 2016-03-03 NOTE — H&P (Signed)
Primary Care Physician:  [name], MD Primary Gastroenterologist:  Dr. Vira Agar  Pre-Procedure History & Physical: HPI:  Marcia Farmer is a 55 y.o. female is here for an colonoscopy.   Past Medical History:  Diagnosis Date  . Dysrhythmia   . Hyperlipidemia   . Mitral valve insufficiency   . Palpitations   . Tricuspid valve insufficiency     Past Surgical History:  Procedure Laterality Date  . ABDOMINAL HYSTERECTOMY    . CHOLECYSTECTOMY    . COLONOSCOPY    . ESOPHAGOGASTRODUODENOSCOPY    . HERNIA REPAIR      Prior to Admission medications   Medication Sig Start Date End Date Taking? Authorizing Provider  ALPRAZolam (XANAX) 0.25 MG tablet Take 1 tablet (0.25 mg total) by mouth every 8 (eight) hours as needed for anxiety. 12/19/15  Yes Richard Maceo Pro., MD  cyclobenzaprine (FLEXERIL) 10 MG tablet TAKE ONE TABLET BY MOUTH AT BEDTIME AS NEEDED FOR PAIN 01/09/16  Yes Richard Maceo Pro., MD  ibuprofen (ADVIL,MOTRIN) 600 MG tablet Take 600 mg by mouth every 6 (six) hours as needed.   Yes Historical Provider, MD  naproxen (NAPROSYN) 500 MG tablet TAKE ONE TABLET BY MOUTH TWICE DAILY AS NEEDED FOR PAIN 12/20/14  Yes Jerrol Banana., MD  sertraline (ZOLOFT) 50 MG tablet Take 1 tablet (50 mg total) by mouth daily. Patient not taking: Reported on 01/23/2016 12/19/15   Jerrol Banana., MD    Allergies as of 02/22/2016 - Review Complete 01/23/2016  Allergen Reaction Noted  . Nitrofurantoin Rash 07/12/2015    Family History  Problem Relation Age of Onset  . Heart disease Mother   . Diabetes Father   . Heart disease Father   . Colon cancer Father     Social History   Social History  . Marital status: Married    Spouse name: N/A  . Number of children: N/A  . Years of education: N/A   Occupational History  . Not on file.   Social History Main Topics  . Smoking status: Never Smoker  . Smokeless tobacco: Never Used  . Alcohol use 6.0 - 8.4  oz/week    10 - 14 Glasses of wine per week     Comment: 7-9oz of wine daily  . Drug use: No  . Sexual activity: Not on file   Other Topics Concern  . Not on file   Social History Narrative  . No narrative on file    Review of Systems: See HPI, otherwise negative ROS  Physical Exam: BP 127/76   Pulse 79   Temp 98.1 F (36.7 C) (Tympanic)   Resp 20   Ht 5\' 8"  (1.727 m)   Wt 72.6 kg (160 lb)   SpO2 (!) 18%   BMI 24.33 kg/m  General:   Alert,  pleasant and cooperative in NAD Head:  Normocephalic and atraumatic. Neck:  Supple; no masses or thyromegaly. Lungs:  Clear throughout to auscultation.    Heart:  Regular rate and rhythm. Abdomen:  Soft, nontender and nondistended. Normal bowel sounds, without guarding, and without rebound.   Neurologic:  Alert and  oriented x4;  grossly normal neurologically.  Impression/Plan: Marcia Farmer is here for an colonoscopy to be performed for [facility] colon polyps after piecemeal removal  Risks, benefits, limitations, and alternatives regarding  colonoscopy have been reviewed with the patient.  Questions have been answered.  All parties agreeable.   Gaylyn Cheers, MD  03/03/2016, 2:02 PM

## 2016-03-03 NOTE — Transfer of Care (Signed)
Immediate Anesthesia Transfer of Care Note  Patient: Marcia Farmer  Procedure(s) Performed: Procedure(s): COLONOSCOPY WITH PROPOFOL (N/A)  Patient Location: PACU  Anesthesia Type:General  Level of Consciousness: sedated  Airway & Oxygen Therapy: Patient Spontanous Breathing and Patient connected to nasal cannula oxygen  Post-op Assessment: Report given to RN and Post -op Vital signs reviewed and stable  Post vital signs: Reviewed and stable  Last Vitals:  Vitals:   03/03/16 1316 03/03/16 1500  BP: 127/76 (P) 105/62  Pulse: 79 (P) 68  Resp: 20   Temp: 36.7 C (!) (P) 35.8 C    Last Pain:  Vitals:   03/03/16 1500  TempSrc: (P) Tympanic         Complications: No apparent anesthesia complications

## 2016-03-03 NOTE — Op Note (Signed)
[facility] Gastroenterology Patient Name: Marcia Farmer Procedure Date: 03/03/2016 2:05 PM MRN: [id number] Account #: [id number] Date of Birth: 1961-03-15 Admit Type: Outpatient Age: 55 Room: [facility] ENDO ROOM 4 Gender: Female Note Status: Finalized Procedure:            Colonoscopy Indications:          Therapeutic procedure for colon polyps, Therapeutic                        procedure for known colon adenoma Providers:            Manya Silvas, MD Referring MD:         Janine Ores. Rosanna Randy, MD (Referring MD) Medicines:            Propofol per Anesthesia Complications:        No immediate complications. Procedure:            Pre-Anesthesia Assessment:                       - After reviewing the risks and benefits, the patient                        was deemed in satisfactory condition to undergo the                        procedure.                       After obtaining informed consent, the colonoscope was                        passed under direct vision. Throughout the procedure,                        the patient's blood pressure, pulse, and oxygen                        saturations were monitored continuously. The                        Colonoscope was introduced through the anus and                        advanced to the the cecum, identified by appendiceal                        orifice and ileocecal valve. The colonoscopy was                        somewhat difficult due to a tortuous colon. Successful                        completion of the procedure was aided by applying                        abdominal pressure. The patient tolerated the procedure                        well. The quality of the bowel preparation was  excellent. Findings:      The nodular remains of a previously removed medium polyp was found in       the ascending colon. The nodules were small and scattered in a linear       fashion. The polyp bumps  were removed with a jumbo cold forceps.       Resection and retrieval were complete. Coagulation for tissue       destruction using argon plasma at 0.4 liters/minute and 30 watts was       successful. To close a defect after polypectomy, three hemostatic clips       were successfully placed. There was no bleeding at the end of the       procedure.      A small polyp was found in the hepatic flexure. The polyp was sessile.       The polyp was removed with a hot snare. Resection and retrieval were       complete.      Internal hemorrhoids were found during endoscopy. The hemorrhoids were       small and Grade I (internal hemorrhoids that do not prolapse).      A diminutive polyp was found in the ascending colon. The polyp was       sessile. The polyp was removed with a hot snare. Resection was complete,       but the polyp tissue was not retrieved. Impression:           - One medium polyp in the ascending colon, removed with                        a jumbo cold forceps. Resected and retrieved. Treated                        with argon plasma coagulation (APC). Clips were placed.                       - One small polyp at the hepatic flexure, removed with                        a hot snare. Resected and retrieved.                       - Internal hemorrhoids. Recommendation:       - Await pathology results. Manya Silvas, MD 03/03/2016 3:08:41 PM This report has been signed electronically. Number of Addenda: 0 Note Initiated On: 03/03/2016 2:05 PM Scope Withdrawal Time: 0 hours 46 minutes 42 seconds  Total Procedure Duration: 0 hours 52 minutes 30 seconds       [facility]

## 2016-03-03 NOTE — Anesthesia Preprocedure Evaluation (Signed)
Anesthesia Evaluation  Patient identified by MRN, date of birth, ID band Patient awake    Reviewed: Allergy & Precautions, NPO status , Patient's Chart, lab work & pertinent test results  Airway Mallampati: I       Dental  (+) Teeth Intact   Pulmonary neg pulmonary ROS,    breath sounds clear to auscultation       Cardiovascular + dysrhythmias Supra Ventricular Tachycardia  Rhythm:Regular Rate:Normal     Neuro/Psych negative neurological ROS  negative psych ROS   GI/Hepatic negative GI ROS, Neg liver ROS,   Endo/Other  negative endocrine ROS  Renal/GU negative Renal ROS     Musculoskeletal negative musculoskeletal ROS (+)   Abdominal Normal abdominal exam  (+)   Peds  Hematology negative hematology ROS (+)   Anesthesia Other Findings   Reproductive/Obstetrics                             Anesthesia Physical Anesthesia Plan  ASA: II  Anesthesia Plan: General   Post-op Pain Management:    Induction: Intravenous  Airway Management Planned: Natural Airway and Nasal Cannula  Additional Equipment:   Intra-op Plan:   Post-operative Plan:   Informed Consent: I have reviewed the patients History and Physical, chart, labs and discussed the procedure including the risks, benefits and alternatives for the proposed anesthesia with the patient or authorized representative who has indicated his/her understanding and acceptance.     Plan Discussed with: CRNA  Anesthesia Plan Comments:         Anesthesia Quick Evaluation

## 2016-03-03 NOTE — Anesthesia Postprocedure Evaluation (Signed)
Anesthesia Post Note  Patient: Marcia Farmer  Procedure(s) Performed: Procedure(s) (LRB): COLONOSCOPY WITH PROPOFOL (N/A)  Patient location during evaluation: Endoscopy Anesthesia Type: General Level of consciousness: awake and alert and oriented Pain management: pain level controlled Vital Signs Assessment: post-procedure vital signs reviewed and stable Respiratory status: spontaneous breathing, nonlabored ventilation and respiratory function stable Cardiovascular status: blood pressure returned to baseline and stable Postop Assessment: no signs of nausea or vomiting Anesthetic complications: no     Last Vitals:  Vitals:   03/03/16 1520 03/03/16 1530  BP: 109/76 114/68  Pulse:    Resp:    Temp:      Last Pain:  Vitals:   03/03/16 1500  TempSrc: Tympanic                 Keefe Zawistowski

## 2016-03-04 ENCOUNTER — Encounter: Payer: Self-pay | Admitting: Unknown Physician Specialty

## 2016-03-05 LAB — SURGICAL PATHOLOGY

## 2016-04-22 ENCOUNTER — Encounter: Payer: Self-pay | Admitting: Emergency Medicine

## 2016-04-23 ENCOUNTER — Encounter: Payer: Self-pay | Admitting: Family Medicine

## 2016-04-23 ENCOUNTER — Ambulatory Visit (INDEPENDENT_AMBULATORY_CARE_PROVIDER_SITE_OTHER): Payer: BLUE CROSS/BLUE SHIELD | Admitting: Family Medicine

## 2016-04-23 VITALS — BP 108/70 | HR 76 | Temp 98.0°F | Resp 16 | Wt 174.0 lb

## 2016-04-23 DIAGNOSIS — F4322 Adjustment disorder with anxiety: Secondary | ICD-10-CM

## 2016-04-23 DIAGNOSIS — M545 Low back pain, unspecified: Secondary | ICD-10-CM | POA: Insufficient documentation

## 2016-04-23 LAB — POCT URINALYSIS DIPSTICK
Bilirubin, UA: NEGATIVE
Blood, UA: NEGATIVE
GLUCOSE UA: NEGATIVE
Ketones, UA: NEGATIVE
LEUKOCYTES UA: NEGATIVE
NITRITE UA: NEGATIVE
Protein, UA: NEGATIVE
Spec Grav, UA: 1.01
Urobilinogen, UA: 0.2
pH, UA: 7.5

## 2016-04-23 NOTE — Patient Instructions (Signed)
Use heating and NSAID's (Naproxen, ibuprofen) as needed for back pain.

## 2016-04-23 NOTE — Progress Notes (Signed)
Patient: Marcia Farmer Female    DOB: [birth year]   56 y.o.   MRN: [id number] Visit Date: 04/23/2016  Today's Provider: [name], MD   Chief Complaint  Patient presents with  . Anxiety   Subjective:    HPI    Anxiety: Patient complains of adaptation reaction.  She has the following symptoms: difficulty concentrating, fatigue, palpitations. Onset of symptoms was approximately several months ago, gradually improving since that time. She denies current suicidal and homicidal ideation. Family history significant for depression. Pt currently taking Xanax 0.25 mg PRN. Pt reports she has only taken this medication about 10 times since being prescribed. Pt also tried Zoloft for the anxiety, but was reluctant to take the medication because she did not want to be on an antidepressant. Pt took the Zoloft for about 5 days before discontinuing it.  Back Pain Pt c/o low back pain on the right side. Has been occurring for 1-2 months. Pt denies dysuria. Pt takes Naproxen PRN (while driving long distances), with relief. Pt reports "slouching" and bending aggravates the pain.   Allergies  Allergen Reactions  . Nitrofurantoin Rash     Current Outpatient Prescriptions:  .  ALPRAZolam (XANAX) 0.25 MG tablet, Take 1 tablet (0.25 mg total) by mouth every 8 (eight) hours as needed for anxiety., Disp: 90 tablet, Rfl: 5 .  cyclobenzaprine (FLEXERIL) 10 MG tablet, TAKE ONE TABLET BY MOUTH AT BEDTIME AS NEEDED FOR PAIN, Disp: 30 tablet, Rfl: 5 .  ibuprofen (ADVIL,MOTRIN) 600 MG tablet, Take 600 mg by mouth every 6 (six) hours as needed., Disp: , Rfl:  .  naproxen (NAPROSYN) 500 MG tablet, TAKE ONE TABLET BY MOUTH TWICE DAILY AS NEEDED FOR PAIN, Disp: 60 tablet, Rfl: 0  Review of Systems  Constitutional: Positive for fatigue. Negative for activity change, appetite change, chills, diaphoresis and fever.  Cardiovascular: Positive for palpitations. Negative for chest pain.  Musculoskeletal:  Positive for back pain.  Psychiatric/Behavioral: Positive for decreased concentration. The patient is nervous/anxious.     Social History  Substance Use Topics  . Smoking status: Never Smoker  . Smokeless tobacco: Never Used  . Alcohol use 6.0 - 8.4 oz/week    10 - 14 Glasses of wine per week     Comment: 7-9oz of wine daily   Objective:   BP 108/70 (BP Location: Right Arm, Patient Position: Sitting, Cuff Size: Normal)   Pulse 76   Temp 98 F (36.7 C) (Oral)   Resp 16   Wt 174 lb (78.9 kg)   BMI 26.46 kg/m   Physical Exam  Constitutional: She appears well-developed and well-nourished.  Cardiovascular: Normal rate, regular rhythm and normal heart sounds.   Pulmonary/Chest: Effort normal and breath sounds normal. No respiratory distress.  Abdominal: Soft. She exhibits no distension. There is no tenderness.  Musculoskeletal:  Low right back tender with palpation, worsened when bending.  Psychiatric: She has a normal mood and affect. Her behavior is normal.        Assessment & Plan:     1. Right-sided low back pain without sciatica, unspecified chronicity New problem. Most likely muscular. UA negative. Use NSAID's and heating pad PRN for pain. - POCT urinalysis dipstick Results for orders placed or performed in visit on 04/23/16  POCT urinalysis dipstick  Result Value Ref Range   Color, UA yellow    Clarity, UA clear    Glucose, UA Negative    Bilirubin, UA Negative    Ketones,  UA Negative    Spec Grav, UA 1.010    Blood, UA Negative    pH, UA 7.5    Protein, UA Negative    Urobilinogen, UA 0.2    Nitrite, UA Negative    Leukocytes, UA Negative Negative    2. Adjustment disorder with anxious mood Stable. Continue alprazolam PRN for stress. Pt refuses SSRI use. Pt seems stable without SSRI. Call office if sx worsen. FU PRN.    Patient seen and examined by Miguel Aschoff, MD, and note scribed by Renaldo Fiddler, CMA. I have done the exam and reviewed the above  chart and it is accurate to the best of my knowledge. Development worker, community has been used in this note in any air is in the dictation or transcription are unintentional.  [name], MD  [place]

## 2016-04-25 ENCOUNTER — Encounter: Payer: Self-pay | Admitting: Family Medicine

## 2016-07-08 DIAGNOSIS — H5203 Hypermetropia, bilateral: Secondary | ICD-10-CM | POA: Diagnosis not present

## 2016-07-14 ENCOUNTER — Ambulatory Visit
Admission: RE | Admit: 2016-07-14 | Discharge: 2016-07-14 | Disposition: A | Discharge status: Home / Self Care | Payer: BLUE CROSS/BLUE SHIELD | Source: Ambulatory Visit | Attending: Family Medicine | Admitting: Family Medicine

## 2016-07-14 ENCOUNTER — Encounter: Payer: Self-pay | Admitting: Family Medicine

## 2016-07-14 ENCOUNTER — Ambulatory Visit (INDEPENDENT_AMBULATORY_CARE_PROVIDER_SITE_OTHER): Payer: BLUE CROSS/BLUE SHIELD | Admitting: Family Medicine

## 2016-07-14 VITALS — BP 134/80 | HR 82 | Temp 98.1°F | Resp 16 | Wt 169.0 lb

## 2016-07-14 DIAGNOSIS — M47816 Spondylosis without myelopathy or radiculopathy, lumbar region: Secondary | ICD-10-CM | POA: Diagnosis not present

## 2016-07-14 DIAGNOSIS — M546 Pain in thoracic spine: Secondary | ICD-10-CM | POA: Diagnosis not present

## 2016-07-14 DIAGNOSIS — M545 Low back pain, unspecified: Secondary | ICD-10-CM

## 2016-07-14 LAB — POCT URINALYSIS DIPSTICK
Bilirubin, UA: NEGATIVE
Blood, UA: NEGATIVE
GLUCOSE UA: NEGATIVE
KETONES UA: NEGATIVE
LEUKOCYTES UA: NEGATIVE
Nitrite, UA: NEGATIVE
PROTEIN UA: NEGATIVE
SPEC GRAV UA: 1.015 (ref 1.010–1.025)
UROBILINOGEN UA: 0.2 U/dL
pH, UA: 7 (ref 5.0–8.0)

## 2016-07-14 MED ORDER — MELOXICAM 15 MG PO TABS: 15.0000 mg | ORAL_TABLET | Freq: Every day | ORAL | 2 refills | Status: DC

## 2016-07-14 NOTE — Progress Notes (Signed)
Subjective:  HPI Pt is her for back pain. She reports that she was here in in February with this same pain. UA was negative and thought it was muscular. No urinary symptoms.  Pt reports that this has gotten no better, in fact it has gotten worse. She was treated with Naproxen and muscle relaxer, which did not help. She also reports that she is very tender in her right hip area. She says the pain is worse when bending or getting out of a chair. She says when she pushes against her tub at home it feels like there is a big knot in her back.   Prior to Admission medications   Medication Sig Start Date End Date Taking? Authorizing Provider  ALPRAZolam (XANAX) 0.25 MG tablet Take 1 tablet (0.25 mg total) by mouth every 8 (eight) hours as needed for anxiety. 12/19/15   Richard Maceo Pro., MD  cyclobenzaprine (FLEXERIL) 10 MG tablet TAKE ONE TABLET BY MOUTH AT BEDTIME AS NEEDED FOR PAIN 01/09/16   Jerrol Banana., MD  ibuprofen (ADVIL,MOTRIN) 600 MG tablet Take 600 mg by mouth every 6 (six) hours as needed.    Historical Provider, MD  naproxen (NAPROSYN) 500 MG tablet TAKE ONE TABLET BY MOUTH TWICE DAILY AS NEEDED FOR PAIN 12/20/14   Jerrol Banana., MD    Patient Active Problem List   Diagnosis Date Noted  . Low back pain 04/23/2016  . 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 Family history of colon cancer 07/10/2015  . Bradycardia 03/02/2014  . Cardiac murmur 02/28/2014  . Combined fat and carbohydrate induced hyperlipemia 02/28/2014  . Paroxysmal supraventricular tachycardia ([place]) 02/28/2014  . Cholelithiasis and cholecystitis without obstruction 10/05/2006    Past Medical History:  Diagnosis Date  . Dysrhythmia   . Hyperlipidemia   . Mitral valve insufficiency   . Palpitations   . Tricuspid valve insufficiency     Social History   Social History  . Marital status: Married    Spouse name: N/A  . Number of children: N/A  . Years of education: N/A   Occupational History  .  Not on file.   Social History Main Topics  . Smoking status: Never Smoker  . Smokeless tobacco: Never Used  . Alcohol use 6.0 - 8.4 oz/week    10 - 14 Glasses of wine per week     Comment: 7-9oz of wine daily  . Drug use: No  . Sexual activity: Not on file   Other Topics Concern  . Not on file   Social History Narrative  . No narrative on file    Allergies  Allergen Reactions  . Nitrofurantoin Rash    Review of Systems  Musculoskeletal: Positive for back pain.    Immunization History  Administered Date(s) Administered  . Tdap 12/11/2009    Objective:  BP 134/80 (BP Location: Left Arm, Patient Position: Sitting, Cuff Size: Normal)   Pulse 82   Temp 98.1 F (36.7 C) (Oral)   Resp 16   Wt 169 lb (76.7 kg)   SpO2 98%   BMI 25.70 kg/m   Physical Exam  Constitutional: She is oriented to person, place, and time and well-developed, well-nourished, and in no distress.  Eyes: Conjunctivae and EOM are normal. Pupils are equal, round, and reactive to light.  Neck: Normal range of motion. Neck supple.  Cardiovascular: Normal rate, regular rhythm, normal heart sounds and intact distal pulses.   Pulmonary/Chest: Effort normal and breath sounds normal.  Musculoskeletal: She exhibits no edema, tenderness or deformity.  Decreased movement int he back with flexion.   Neurological: She is alert and oriented to person, place, and time. She has normal reflexes. Gait normal. GCS score is 15.  Skin: Skin is warm and dry.  Psychiatric: Mood, memory, affect and judgment normal.    Lab Results  Component Value Date   WBC 6.7 12/11/2015   HGB 14.8 12/11/2015   HCT 41.5 12/11/2015   PLT 251 12/11/2015   GLUCOSE 103 (H) 12/11/2015   CHOL 268 (H) 02/11/2016   TRIG 151 (H) 02/11/2016   HDL 64 02/11/2016   LDLCALC 174 (H) 02/11/2016   TSH 2.091 12/11/2015    CMP     Component Value Date/Time   NA 136 12/11/2015 1325   K 4.2 12/11/2015 1325   CL 100 (L) 12/11/2015 1325   CO2  31 12/11/2015 1325   GLUCOSE 103 (H) 12/11/2015 1325   BUN 14 12/11/2015 1325   CREATININE 0.82 12/11/2015 1325   CALCIUM 9.8 12/11/2015 1325   PROT 7.8 02/11/2016 1207   ALBUMIN 5.0 02/11/2016 1207   AST 19 02/11/2016 1207   ALT 20 02/11/2016 1207   ALKPHOS 94 02/11/2016 1207   BILITOT 0.4 02/11/2016 1207   GFRNONAA >60 12/11/2015 1325   GFRAA >60 12/11/2015 1325    Assessment and Plan :   1. Right-sided low back pain without sciatica, unspecified chronicity 2.DDD likely - POCT urinalysis dipstick - DG Lumbar Spine 2-3 Views; Future - Ambulatory referral to Physical Therapy - DG Thoracic Spine 2 View; Future - meloxicam (MOBIC) 15 MG tablet; Take 1 tablet (15 mg total) by mouth daily.  Dispense: 30 tablet; Refill: 2  HPI, Exam, and A&P Transcribed under the direction and in the presence of Richard L. Cranford Mon, MD  Electronically Signed: Katina Dung, [place] MD [facility] 07/14/2016 11:56 AM

## 2016-08-13 ENCOUNTER — Ambulatory Visit (INDEPENDENT_AMBULATORY_CARE_PROVIDER_SITE_OTHER): Payer: BLUE CROSS/BLUE SHIELD | Admitting: Family Medicine

## 2016-08-13 ENCOUNTER — Encounter: Payer: Self-pay | Admitting: Physical Therapy

## 2016-08-13 ENCOUNTER — Ambulatory Visit: Payer: BLUE CROSS/BLUE SHIELD | Attending: Family Medicine | Admitting: Physical Therapy

## 2016-08-13 ENCOUNTER — Encounter: Payer: Self-pay | Admitting: Family Medicine

## 2016-08-13 VITALS — BP 122/78 | HR 78 | Temp 97.9°F | Resp 16 | Wt 167.0 lb

## 2016-08-13 DIAGNOSIS — M62830 Muscle spasm of back: Secondary | ICD-10-CM | POA: Insufficient documentation

## 2016-08-13 DIAGNOSIS — M6281 Muscle weakness (generalized): Secondary | ICD-10-CM | POA: Insufficient documentation

## 2016-08-13 DIAGNOSIS — M545 Low back pain, unspecified: Secondary | ICD-10-CM

## 2016-08-13 NOTE — Patient Instructions (Addendum)
Try Vicks vapor rub on your toes toes at bedtime. If the right side pain does not improve let us know and we will order a renal ultrasound to look at your kidneys. Stop meloxicam by July 1st.

## 2016-08-13 NOTE — Progress Notes (Signed)
Subjective:  HPI Pt is here for a follow up of back pain. She reports that her back is better, however the right sided back pain is still there. The meloicam has helped the other back pain except that. She reports that she just started PT today because they could not get her in until totay. She also has a CPE form to be filled out her CPE was 01/23/16.  Prior to Admission medications   Medication Sig Start Date End Date Taking? Authorizing Provider  ALPRAZolam (XANAX) 0.25 MG tablet Take 1 tablet (0.25 mg total) by mouth every 8 (eight) hours as needed for anxiety. 12/19/15  Yes Jerrol Banana., MD  ibuprofen (ADVIL,MOTRIN) 600 MG tablet Take 600 mg by mouth every 6 (six) hours as needed.   Yes [provider]  meloxicam (MOBIC) 15 MG tablet Take 1 tablet (15 mg total) by mouth daily. 07/14/16  Yes Jerrol Banana., MD  Aspirin-Caffeine [facility] FAST PAIN RELIEF PO) Take by mouth as needed.    [provider]  cyclobenzaprine (FLEXERIL) 10 MG tablet TAKE ONE TABLET BY MOUTH AT BEDTIME AS NEEDED FOR PAIN Patient not taking: Reported on 08/13/2016 01/09/16   Jerrol Banana., MD  naproxen (NAPROSYN) 500 MG tablet TAKE ONE TABLET BY MOUTH TWICE DAILY AS NEEDED FOR PAIN Patient not taking: Reported on 08/13/2016 12/20/14   Jerrol Banana., MD    Patient Active Problem List   Diagnosis Date Noted  . Low back pain 04/23/2016  . 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 Family history of colon cancer 07/10/2015  . Bradycardia 03/02/2014  . Cardiac murmur 02/28/2014  . Combined fat and carbohydrate induced hyperlipemia 02/28/2014  . Paroxysmal supraventricular tachycardia ([place]) 02/28/2014  . Cholelithiasis and cholecystitis without obstruction 10/05/2006    Past Medical History:  Diagnosis Date  . Dysrhythmia   . Hyperlipidemia   . Mitral valve insufficiency   . Palpitations   . Tricuspid valve insufficiency     Social History   Social History  . Marital  status: Married    Spouse name: N/A  . Number of children: N/A  . Years of education: N/A   Occupational History  . Not on file.   Social History Main Topics  . Smoking status: Never Smoker  . Smokeless tobacco: Never Used  . Alcohol use 6.0 - 8.4 oz/week    10 - 14 Glasses of wine per week     Comment: 7-9oz of wine daily  . Drug use: No  . Sexual activity: Not on file   Other Topics Concern  . Not on file   Social History Narrative  . No narrative on file    Allergies  Allergen Reactions  . Nitrofurantoin Rash    Review of Systems  Constitutional: Negative.   HENT: Negative.   Eyes: Negative.   Respiratory: Negative.   Cardiovascular: Negative.   Gastrointestinal: Negative.   Genitourinary: Negative.   Musculoskeletal: Positive for back pain.  Skin: Negative.   Neurological: Negative.   Endo/Heme/Allergies: Negative.   Psychiatric/Behavioral: Negative.     Immunization History  Administered Date(s) Administered  . Tdap 12/11/2009    Objective:  BP 122/78 (BP Location: Left Arm, Patient Position: Sitting, Cuff Size: Normal)   Pulse 78   Temp 97.9 F (36.6 C) (Oral)   Resp 16   Wt 167 lb (75.8 kg)   BMI 25.39 kg/m   Physical Exam  Constitutional: She is oriented to person, place, and  time and well-developed, well-nourished, and in no distress.  Eyes: Conjunctivae and EOM are normal. Pupils are equal, round, and reactive to light.  Neck: Normal range of motion. Neck supple.  Cardiovascular: Normal rate, regular rhythm, normal heart sounds and intact distal pulses.   Pulmonary/Chest: Effort normal and breath sounds normal.  Musculoskeletal: Normal range of motion.  Neurological: She is alert and oriented to person, place, and time. She has normal reflexes. Gait normal. GCS score is 15.  Skin: Skin is warm and dry.  Psychiatric: Mood, memory, affect and judgment normal.    Lab Results  Component Value Date   WBC 6.7 12/11/2015   HGB 14.8  12/11/2015   HCT 41.5 12/11/2015   PLT 251 12/11/2015   GLUCOSE 103 (H) 12/11/2015   CHOL 268 (H) 02/11/2016   TRIG 151 (H) 02/11/2016   HDL 64 02/11/2016   LDLCALC 174 (H) 02/11/2016   TSH 2.091 12/11/2015    CMP     Component Value Date/Time   NA 136 12/11/2015 1325   K 4.2 12/11/2015 1325   CL 100 (L) 12/11/2015 1325   CO2 31 12/11/2015 1325   GLUCOSE 103 (H) 12/11/2015 1325   BUN 14 12/11/2015 1325   CREATININE 0.82 12/11/2015 1325   CALCIUM 9.8 12/11/2015 1325   PROT 7.8 02/11/2016 1207   ALBUMIN 5.0 02/11/2016 1207   AST 19 02/11/2016 1207   ALT 20 02/11/2016 1207   ALKPHOS 94 02/11/2016 1207   BILITOT 0.4 02/11/2016 1207   GFRNONAA >60 12/11/2015 1325   GFRAA >60 12/11/2015 1325    Assessment and Plan :  1. Right-sided low back pain without sciatica, unspecified chronicity If not improved with PT will get renal ultrasound. I presently think this is muscular.  HPI, Exam, and A&P Transcribed under the direction and in the presence of Richard L. Cranford Mon, MD  Electronically Signed: Katina Dung, CMA I have done the exam and reviewed the above chart and it is accurate to the best of my knowledge. Development worker, community has been used in this note in any air is in the dictation or transcription are unintentional.  [facility] 08/13/2016 4:26 PM

## 2016-08-13 NOTE — Therapy (Signed)
[facility] [address] [place], [address] Phone: [phone]   Fax:  [phone]  Physical Therapy Evaluation  Patient Details  Name: Remedy Corporan MRN: [id number] Date of Birth: 1960-11-06 Referring Provider: Miguel Aschoff MD   Encounter Date: 08/13/2016      PT End of Session - 08/13/16 1233    Visit Number 1   Number of Visits 12   Date for PT Re-Evaluation 09/24/16   PT Start Time 1133   PT Stop Time 1233   PT Time Calculation (min) 60 min   Activity Tolerance Patient tolerated treatment well   Behavior During Therapy [facility] for tasks assessed/performed      Past Medical History:  Diagnosis Date  . Dysrhythmia   . Hyperlipidemia   . Mitral valve insufficiency   . Palpitations   . Tricuspid valve insufficiency     Past Surgical History:  Procedure Laterality Date  . ABDOMINAL HYSTERECTOMY    . CHOLECYSTECTOMY    . COLONOSCOPY    . COLONOSCOPY WITH PROPOFOL N/A 03/03/2016   Procedure: COLONOSCOPY WITH PROPOFOL;  Surgeon: Manya Silvas, MD;  [facility] ENDOSCOPY;  Service: Endoscopy;  Laterality: N/A;  . ESOPHAGOGASTRODUODENOSCOPY    . HERNIA REPAIR      There were no vitals filed for this visit.       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 She is currently having lower back pain that is severe at times and right sided constant back pain/discomfort.    Pertinent History Patient reports history of back pain 6 years ago when lifting something at home/ a pot of dirt; felt pain in back and a "pop"; She has had episodes of pain ever since;  January went to [facility] for work and back began hurting for no apparent reason;     Limitations Lifting;House hold activities   Currently in  Pain? Yes   Pain Score 3    Pain Location Back   Pain Orientation Lower   Pain Descriptors / Indicators Aching;Sharp   Pain Type Chronic pain   Pain Onset More than a month ago   Pain Frequency Intermittent   Aggravating Factors  squatting   Pain Relieving Factors rest, meloxicam   Effect of Pain on Daily Activities limits function with             [facility] PT Assessment - 08/13/16 1142      Assessment   Medical Diagnosis right sided low back pain without sciatica, unspecified chronicity   Referring Provider Miguel Aschoff MD    Onset Date/Surgical Date 03/17/16   Hand Dominance Right   Prior Therapy yes      Precautions   Precautions None     Balance Screen   Has the patient fallen in the past 6 months No   Has the patient had a decrease in activity level because of a fear of falling?  No   Is the patient reluctant to leave their home because of a fear of falling?  No     Home Social worker Private residence   Living Arrangements Spouse/significant other;Children   Type of [place] to enter   Entrance Stairs-Number of Steps 3   Entrance Stairs-Rails Left  up  Home Layout One level     Prior Function   Level of Independence Independent   Vocation Full time employment   Vocation Requirements driving, walking, sitting   Leisure exercise, family     Cognition   Overall Cognitive Status Within Functional Limits for tasks assessed     Observation/Other Assessments   Observations Gait; WNL   Modified [place] 40%  0 = no self perceived disabiltiy     Sensation   Light Touch Appears Intact     Posture/Postural Control   Posture/Postural Control No significant limitations     AROM   Overall AROM  Deficits   AROM Assessment Site Lumbar  limited flexion, extension 25% with pain in lower back   Lumbar Flexion decreased 25% with tight hamstrings   Lumbar Extension limited 25% with pain in lower back   Lumbar - Right  Side Bend WFL   Lumbar - Left Side Bend [facility]     Strength   Overall Strength Within functional limits for tasks performed     Palpation   Palpation comment lumbar spine paravertebral muscles +spasms right side     Special Tests    Special Tests Lumbar   Lumbar Tests Slump Test     Slump test   Findings Negative   Side Right and left     Straight Leg Raise   Findings Negative   Side  Left  and right            Objective measurements completed on examination: See above findings.                  PT Education - 08/13/16 1200    Education provided Yes   Education Details POC, posture awareness, exercises for home   Person(s) Educated Patient   Methods Explanation;Demonstration;Verbal cues;Handout   Comprehension Verbalized understanding;Returned demonstration;Verbal cues required             PT Long Term Goals - 08/13/16 1200      PT LONG TERM GOAL #1   Title  Patient will demonstrate improved function with daily tasks and decreased back pain to moderate or better and improve sleep as indicated by MODI score of  20% or less by 09/24/2016   Baseline 40%   Status New     PT LONG TERM GOAL #2   Title Patient will demonstrate improved posture awareness and pain control strategies to 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   Baseline unable to drive long distances for work or 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 She is limited in daily tasks due to her pain which ranges from 3/10 up to 8/10 and is intermittent. Her MODI score is 40% indicating moderate self perceived disability. She has limited knowledge  of appropriate pain control strategies and exercise progression and will benefit from physical therapy intervention to improve function and achieve goals.     History and Personal Factors relevant to plan of care: pain since 03/2016 following drive to [place]. wosening since with constant right back pain. limitations with ADLs include washing, dressing, disturbed sleep, limited with driving due to pain. 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    Clinical Presentation Evolving   Clinical Presentation due to: worsening pain in back, more constanct since January 2018.    Clinical Decision Making Low   Rehab Potential Good   Clinical Impairments Affecting Rehab Potential (+)motivated, prior level of function (-)chronic pain with 4-5 year history of back pain episodes   PT Frequency 2x / week   PT Duration 6 weeks   PT Treatment/Interventions Electrical Stimulation;Moist Heat;Ultrasound;Patient/family education;Neuromuscular re-education;Therapeutic exercise;Manual techniques   PT Next Visit Plan manual therapy, modalities for pain control, muscle spasms   PT Home Exercise Plan prone over pillow; stabilization with resistive band standing, ball stabilization with glute sets   Consulted and Agree with Plan of Care Patient      Patient will benefit from skilled therapeutic intervention in order to improve the following deficits and impairments:  Decreased range of motion, Increased muscle spasms, Impaired perceived functional ability, Pain  Visit Diagnosis: Right-sided low back pain without sciatica, unspecified chronicity - Plan: PT plan of care cert/re-cert  Muscle spasm  of back - Plan: PT plan of care cert/re-cert  Muscle weakness (generalized) - Plan: PT plan of care cert/re-cert     Problem List Patient Active Problem List   Diagnosis Date Noted  . Low back pain 04/23/2016  . 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 Family history of colon cancer 07/10/2015  . Bradycardia 03/02/2014  . Cardiac murmur 02/28/2014  . Combined fat and carbohydrate induced hyperlipemia 02/28/2014  . Paroxysmal supraventricular tachycardia ([place]) 02/28/2014  . Cholelithiasis and cholecystitis without obstruction 10/05/2006    Jomarie Longs PT 08/14/2016, 6:36 PM  [facility] [address] [place], [address] Phone: [phone]   Fax:  [phone]  Name: Mariana Wiederholt MRN: [id number] Date of Birth: Oct 20, 1960

## 2016-08-18 ENCOUNTER — Ambulatory Visit: Payer: BLUE CROSS/BLUE SHIELD | Attending: Family Medicine | Admitting: Physical Therapy

## 2016-08-18 DIAGNOSIS — M62830 Muscle spasm of back: Secondary | ICD-10-CM | POA: Insufficient documentation

## 2016-08-18 DIAGNOSIS — M545 Low back pain: Secondary | ICD-10-CM | POA: Insufficient documentation

## 2016-08-18 DIAGNOSIS — M6281 Muscle weakness (generalized): Secondary | ICD-10-CM | POA: Insufficient documentation

## 2016-08-21 ENCOUNTER — Encounter: Payer: Self-pay | Admitting: Physical Therapy

## 2016-08-21 ENCOUNTER — Ambulatory Visit: Payer: BLUE CROSS/BLUE SHIELD | Admitting: Physical Therapy

## 2016-08-21 DIAGNOSIS — M545 Low back pain, unspecified: Secondary | ICD-10-CM

## 2016-08-21 DIAGNOSIS — M6281 Muscle weakness (generalized): Secondary | ICD-10-CM | POA: Diagnosis not present

## 2016-08-21 DIAGNOSIS — M62830 Muscle spasm of back: Secondary | ICD-10-CM | POA: Diagnosis not present

## 2016-08-22 NOTE — Therapy (Signed)
[place] [facility] [address], [place], [address] Phone: [phone]   Fax:  [phone]  Physical Therapy Treatment  Patient Details  Name: Marcia Farmer MRN: [id number] Date of Birth: [birth year] Referring Provider: Miguel Aschoff MD   Encounter Date: 08/21/2016      PT End of Session - 08/21/16 1043    Visit Number 2   Number of Visits 12   Date for PT Re-Evaluation 09/24/16   PT Start Time 5573   PT Stop Time 1120   PT Time Calculation (min) 40 min   Activity Tolerance Patient tolerated treatment well   Behavior During Therapy [facility] for tasks assessed/performed      Past Medical History:  Diagnosis Date  . Dysrhythmia   . Hyperlipidemia   . Mitral valve insufficiency   . Palpitations   . Tricuspid valve insufficiency     Past Surgical History:  Procedure Laterality Date  . ABDOMINAL HYSTERECTOMY    . CHOLECYSTECTOMY    . COLONOSCOPY    . COLONOSCOPY WITH PROPOFOL N/A 03/03/2016   Procedure: COLONOSCOPY WITH PROPOFOL;  Surgeon: Manya Silvas, MD;  Location: [facility] ENDOSCOPY;  Service: Endoscopy;  Laterality: N/A;  . ESOPHAGOGASTRODUODENOSCOPY    . HERNIA REPAIR      There were no vitals filed for this visit.      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 She is still having right lower back pain and this is her concern today.    Pertinent History Patient reports history of back pain 6 years ago when lifting something at home/ a pot of dirt; felt pain in back and a "pop"; She has had episodes of pain ever since;  January went to [employer] for work and back began hurting for no apparent reason;     Limitations Lifting;House hold activities   Currently in Pain? Yes   Pain Score 3    Pain Location Back   Pain Orientation Right;Lower   Pain Descriptors / Indicators Aching;Spasm   Pain Type Chronic pain    Pain Onset More than a month ago   Pain Frequency Intermittent      Objective: Palpation: lumbar spine; moderate spasms palpable along right paravertebral muscles with patient standing and prone lying over pillow  Treatment: Therapeutic exercise: patient performed exercises with guidance, VC, tactile cues and demonstration of therapist: Sitting: Hip adduction with ball and glute sets with VC for correct technique 15 reps Hip abduction with green resistive band x 15 reps Standing:  Side stepping with green resistive band around thighs along counter for safety 2 min. Prone lying: Hip ER isometric x 10 reps hold 3-5 seconds Hip extension each LE 3 x 5 reps with controlled motion short arc, stabilizing with trunk  Manual therapy: goal: decreased pain, spasms STM performed to lumbar spine/lower thoracic spine paravertebral muscles with concentration on right side with superficial techniques and compression techniques with patient prone lying over one pillow x 10 min.   Modalities: Electrical stimulation: x 12 min.:  High volt estim.clincial program for muscle spasms (4) electrodes applied to lumbar spine paravertebral muscles with intensity to tolerance with patient prone lying position with pillow under abdomen and moist heat applied to same during estim. Treatment: no adverse reaction noted: goal: pain, spasms  Patient response to treatment: patient demonstrated improved technique with exercises with moderate VC for correct alignment. Patient with decreased pain  from 3/10 to  1/10. Patient with decreased spasms by 50% following STM and modalities.         PT Education - 08/21/16 1117    Education provided Yes   Education Details home exercises, posture awareness, correct alignment and performance for exercises   Person(s) Educated Patient   Methods Explanation;Demonstration;Verbal cues;Handout   Comprehension Verbalized understanding;Returned demonstration;Verbal cues required              PT Long Term Goals - 08/13/16 1200      PT LONG TERM GOAL #1   Title  Patient will demonstrate improved function with daily tasks and decreased back pain to moderate or better and improve sleep as indicated by MODI score of  20% or less by 09/24/2016   Baseline 40%   Status New     PT LONG TERM GOAL #2   Title Patient will demonstrate improved posture awareness and pain control strategies to 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 She is progressing with exercises and posture awareness for transfers on/off treatment table, in/out of car and with daily tasks and this seems to be contributing to decreasing symptoms. She will benefit from continued physical therapy intervention to further decrease pain and improve function.    Rehab Potential Good   Clinical Impairments Affecting Rehab Potential (+)motivated, prior level of function (-)chronic pain with 4-5 year history of back pain episodes   PT Frequency 2x / week   PT Duration 6 weeks   PT Treatment/Interventions Electrical Stimulation;Moist Heat;Ultrasound;Patient/family education;Neuromuscular re-education;Therapeutic exercise;Manual techniques   PT Next Visit Plan manual therapy, modalities for pain control,  muscle spasms   PT Home Exercise Plan prone over pillow; stabilization with resistive band standing, ball stabilization with glute sets      Patient will benefit from skilled therapeutic intervention in order to improve the following deficits and impairments:  Decreased range of motion, Increased muscle spasms, Impaired perceived functional ability, Pain  Visit Diagnosis: Right-sided low back pain without sciatica, unspecified chronicity  Muscle spasm of back  Muscle weakness (generalized)     Problem List Patient Active Problem List   Diagnosis Date Noted  . Low back pain 04/23/2016  . 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 Family history of colon cancer 07/10/2015  . Bradycardia 03/02/2014  . Cardiac murmur 02/28/2014  . Combined fat and carbohydrate induced hyperlipemia 02/28/2014  . Paroxysmal supraventricular tachycardia ([place]) 02/28/2014  . Cholelithiasis and cholecystitis without obstruction 10/05/2006    Jomarie Longs PT 08/22/2016, 11:22 AM  [place] [facility] [address] [place], [address] Phone: [phone]   Fax:  [phone]  Name: Marcia Farmer MRN: [id number] Date of Birth: [birth year]

## 2016-08-27 ENCOUNTER — Encounter: Payer: Self-pay | Admitting: Physical Therapy

## 2016-08-27 ENCOUNTER — Ambulatory Visit: Payer: BLUE CROSS/BLUE SHIELD | Admitting: Physical Therapy

## 2016-08-27 DIAGNOSIS — M545 Low back pain, unspecified: Secondary | ICD-10-CM

## 2016-08-27 DIAGNOSIS — M62830 Muscle spasm of back: Secondary | ICD-10-CM

## 2016-08-27 DIAGNOSIS — M6281 Muscle weakness (generalized): Secondary | ICD-10-CM

## 2016-08-28 NOTE — Therapy (Signed)
[place] [facility] [address], [place], [address] Phone: [phone]   Fax:  [phone]  Physical Therapy Treatment  Patient Details  Name: Marcia Farmer MRN: [id number] Date of Birth: 11/19/1960 Referring Provider: Miguel Aschoff MD   Encounter Date: 08/27/2016      PT End of Session - 08/27/16 1227    Visit Number 3   Number of Visits 12   Date for PT Re-Evaluation 09/24/16   PT Start Time 1146   PT Stop Time 1220   PT Time Calculation (min) 34 min   Activity Tolerance Patient tolerated treatment well   Behavior During Therapy [facility] for tasks assessed/performed      Past Medical History:  Diagnosis Date  . Dysrhythmia   . Hyperlipidemia   . Mitral valve insufficiency   . Palpitations   . Tricuspid valve insufficiency     Past Surgical History:  Procedure Laterality Date  . ABDOMINAL HYSTERECTOMY    . CHOLECYSTECTOMY    . COLONOSCOPY    . COLONOSCOPY WITH PROPOFOL N/A 03/03/2016   Procedure: COLONOSCOPY WITH PROPOFOL;  Surgeon: Manya Silvas, MD;  Location: [facility] ENDOSCOPY;  Service: Endoscopy;  Laterality: N/A;  . ESOPHAGOGASTRODUODENOSCOPY    . HERNIA REPAIR      There were no vitals filed for this visit.      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 She continues with right lower back pain that bothers her with certain movements and turning over in bed.    Pertinent History Patient reports history of back pain 6 years ago when lifting something at home/ a pot of dirt; felt pain in back and a "pop"; She has had episodes of pain ever since;  January went to [facility] for work and back began hurting for no apparent reason;     Limitations Lifting;House hold activities   Currently in Pain? Yes   Pain Score 3    Pain Location Back   Pain Orientation Right;Lower   Pain Descriptors /  Indicators Aching   Pain Type Chronic pain   Pain Onset More than a month ago   Pain Frequency Constant     Objective: Palpation: lower back lumbar paravertebral muscles right with mild spasms palpable, decreased from previous session AROM: lower trunk rotation decreased with stiffness reported with right rotation supine lying  Treatment: Therapeutic exercise: patient performed with VC, instruction, demonstration of therapist: goal: independent with home program, pain, improved function Prone lying: Prone progression: over 1 pillow, 2 min. On elbows, press ups x 5 reps Hip extension x 10 reps each LE Upper back extension x 10 Flexion rotation to right and left 3 x 20 seconds with VC and instruction for correct tehnique Hip ER isometrics with ball between heels: unable to tolerate with or without ball therefore discontinued Sitting hip adduction with ball and glute sets x 10 Standing back extension x 5 reps paloff press with 5# at [place] cable press to chest and perpendicular position  Patient response to treatment: Patient demonstrated improvement with exercise performance, alignment and technique with minimal VC and demonstration.iimproved rotation of lumbar spine with less stiffness following treatment.          PT Education - 08/27/16 1200    Education provided Yes   Education Details HEP instruction; added paloff press, felxion rotation for lumbar mobility and pain control   Person(s) Educated  Patient   Methods Explanation;Demonstration;Verbal cues;Handout   Comprehension Verbalized understanding;Returned demonstration;Verbal cues required             PT Long Term Goals - 08/13/16 1200      PT LONG TERM GOAL #1   Title  Patient will demonstrate improved function with daily tasks and decreased back pain to moderate or better and improve sleep as indicated by MODI score of  20% or less by 09/24/2016   Baseline 40%   Status New     PT LONG TERM GOAL #2   Title  Patient will demonstrate improved posture awareness and pain control strategies to 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   Status New     PT [place] #3   Title Patient will be independent with home program for posture awareness, pain control, progressive exercises to allow patient to transition to self management once discharged from physical therapy by 09/24/2016   Baseline limited knowledge of appropriate pain control, posture awareness and progression of exercises and requires guidance, assistance    Status New               Plan - 08/27/16 1225    Clinical Impression Statement Patient demonstrates progress with goals and is able to perform exercises with minimal cuing for correct technique and alignemnt .   Rehab Potential Good   Clinical Impairments Affecting Rehab Potential (+)motivated, prior level of function (-)chronic pain with 4-5 year history of back pain episodes   PT Frequency 2x / week   PT Duration 6 weeks   PT Treatment/Interventions Electrical Stimulation;Moist Heat;Ultrasound;Patient/family education;Neuromuscular re-education;Therapeutic exercise;Manual techniques   PT Next Visit Plan manual therapy, modalities for pain control, muscle spasms   PT Home Exercise Plan prone over pillow; stabilization with resistive band standing, ball stabilization with glute sets      Patient will benefit from skilled therapeutic intervention in order to improve the following deficits and impairments:  Decreased range of motion, Increased muscle spasms, Impaired perceived functional ability, Pain  Visit Diagnosis: Right-sided low back pain without sciatica, unspecified chronicity  Muscle spasm of back  Muscle weakness (generalized)     Problem List Patient Active Problem List   Diagnosis  Date Noted  . Low back pain 04/23/2016  . 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 Family history of colon cancer 07/10/2015  . Bradycardia 03/02/2014  . Cardiac murmur 02/28/2014  . Combined fat and carbohydrate induced hyperlipemia 02/28/2014  . Paroxysmal supraventricular tachycardia ([place]) 02/28/2014  . Cholelithiasis and cholecystitis without obstruction 10/05/2006    Jomarie Longs PT 08/28/2016, 1:26 PM  [place] [facility] [address], [place], [address] Phone: [phone]   Fax:  [phone]  Name: Marcia Farmer MRN: [id number] Date of Birth: September 21, 1960

## 2016-09-01 ENCOUNTER — Ambulatory Visit: Payer: BLUE CROSS/BLUE SHIELD | Admitting: Physical Therapy

## 2016-09-03 ENCOUNTER — Encounter: Payer: Self-pay | Admitting: Physical Therapy

## 2016-09-03 ENCOUNTER — Ambulatory Visit: Payer: BLUE CROSS/BLUE SHIELD | Admitting: Physical Therapy

## 2016-09-03 DIAGNOSIS — M545 Low back pain, unspecified: Secondary | ICD-10-CM

## 2016-09-03 DIAGNOSIS — M6281 Muscle weakness (generalized): Secondary | ICD-10-CM | POA: Diagnosis not present

## 2016-09-03 DIAGNOSIS — M62830 Muscle spasm of back: Secondary | ICD-10-CM | POA: Diagnosis not present

## 2016-09-03 NOTE — Therapy (Signed)
[place] [facility] [address] [place], [address] Phone: [phone]   Fax:  [phone]  Physical Therapy Treatment  Patient Details  Name: Marcia Farmer MRN: [id number] Date of Birth: 1960-05-27 Referring Provider: Miguel Aschoff MD   Encounter Date: 09/03/2016      PT End of Session - 09/03/16 1245    Visit Number 4   Number of Visits 12   Date for PT Re-Evaluation 09/24/16   PT Start Time 1146   PT Stop Time 1235   PT Time Calculation (min) 49 min   Activity Tolerance Patient tolerated treatment well   Behavior During Therapy [facility] for tasks assessed/performed      Past Medical History:  Diagnosis Date  . Dysrhythmia   . Hyperlipidemia   . Mitral valve insufficiency   . Palpitations   . Tricuspid valve insufficiency     Past Surgical History:  Procedure Laterality Date  . ABDOMINAL HYSTERECTOMY    . CHOLECYSTECTOMY    . COLONOSCOPY    . COLONOSCOPY WITH PROPOFOL N/A 03/03/2016   Procedure: COLONOSCOPY WITH PROPOFOL;  Surgeon: Manya Silvas, MD;  Location: [facility] ENDOSCOPY;  Service: Endoscopy;  Laterality: N/A;  . ESOPHAGOGASTRODUODENOSCOPY    . HERNIA REPAIR      There were no vitals filed for this visit.      Subjective Assessment - 09/03/16 1156    Subjective Patient reports that her lower back is much improved and she is contining to have her right sided lower back symptoms.    Pertinent History Patient reports history of back pain 6 years ago when lifting something at home/ a pot of dirt; felt pain in back and a "pop"; She has had episodes of pain ever since;  January went to [employer] for work and back began hurting for no apparent reason;     Limitations Lifting;House hold activities   Currently in Pain? No/denies  right sided spasms    Pain Onset --      Objective: Palpation: lower back lumbar paravertebral muscles right with mild spasms palpable, decreased from previous  session AROM: lower trunk rotation decreased with stiffness reported with right rotation supine lying; chil's pose decreased lateral flexion to left with increased pull on right lower back  Treatment: Manual therapy: 15 min. STM with superficial and compression techniques performed to lower back lumbar paraspinal muscles including QL with patient side lying right/left: goal decrease spasms and pain  Therapeutic exercise: patient performed with VC, instruction, demonstration of therapist: goal: independent with home program, pain, improved function Prone lying: Chid's pose with lateral flexion stretch 3 x 20 seconds each side 2 min. On elbows, press ups x 5 reps Hip extension x 10 reps each LE Upper back extension x 10 Standing:  Standing back extension x 5 reps paloff press with 10# at [place] cable press to chest and perpendicular position 10 reps each position with VC Hip extension at hip rotary machine 85# x 15 reps each  Hip abduction through short arc 55# x 10 reps each Instructed to use theraband for home exercises standing hip extension and abduction  Patient response to treatment: patient able to perform side bend left with rotation with decreased stretch/pain on right following STM and stretches. Improved technique and control with exercises following minimal VC and demonstration.        PT Education - 09/03/16 1245    Education provided Yes   Education Details HEP: child's pose with lateral flexion,  hip abduction and extension with band   Person(s) Educated Patient   Methods Explanation;Demonstration;Verbal cues;Handout   Comprehension Verbalized understanding;Returned demonstration;Verbal cues required             PT Long Term Goals - 08/13/16 1200      PT LONG TERM GOAL #1   Title  Patient will demonstrate improved function with daily tasks and decreased back pain to moderate or better and improve sleep as indicated by MODI score of  20% or less by 09/24/2016    Baseline 40%   Status New     PT LONG TERM GOAL #2   Title Patient will demonstrate improved posture awareness and pain control strategies to 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 She is progressing steadily with all goals and exercises and should continue to improve with additional physical therapy intervention.    Rehab Potential Good   Clinical Impairments Affecting Rehab Potential (+)motivated, prior level of function (-)chronic pain with 4-5 year history of back pain episodes   PT Frequency 2x / week   PT Duration 6 weeks   PT Treatment/Interventions Electrical Stimulation;Moist Heat;Ultrasound;Patient/family education;Neuromuscular re-education;Therapeutic exercise;Manual techniques   PT Next Visit Plan manual therapy, modalities for pain control, muscle spasms   PT Home Exercise Plan prone over pillow; stabilization with resistive band standing, ball stabilization with glute sets      Patient will benefit from skilled therapeutic intervention in order to improve the following deficits and impairments:  Decreased range of motion, Increased muscle spasms, Impaired perceived functional ability, Pain  Visit  Diagnosis: Right-sided low back pain without sciatica, unspecified chronicity  Muscle spasm of back  Muscle weakness (generalized)     Problem List Patient Active Problem List   Diagnosis Date Noted  . Low back pain 04/23/2016  . 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 Family history of colon cancer 07/10/2015  . Bradycardia 03/02/2014  . Cardiac murmur 02/28/2014  . Combined fat and carbohydrate induced hyperlipemia 02/28/2014  . Paroxysmal supraventricular tachycardia ([place]) 02/28/2014  . Cholelithiasis and cholecystitis without obstruction 10/05/2006    Jomarie Longs PT 09/03/2016, 12:58 PM  [place] [facility] [address] [place], [address] Phone: [phone]   Fax:  [phone]  Name: Marcia Farmer MRN: [id number] Date of Birth: 1960/03/28

## 2016-09-08 ENCOUNTER — Ambulatory Visit: Payer: BLUE CROSS/BLUE SHIELD | Admitting: Physical Therapy

## 2016-09-10 ENCOUNTER — Ambulatory Visit: Payer: BLUE CROSS/BLUE SHIELD | Admitting: Physical Therapy

## 2016-09-15 ENCOUNTER — Ambulatory Visit: Payer: BLUE CROSS/BLUE SHIELD | Admitting: Physical Therapy

## 2016-09-22 ENCOUNTER — Ambulatory Visit: Payer: BLUE CROSS/BLUE SHIELD | Admitting: Physical Therapy

## 2016-09-24 ENCOUNTER — Ambulatory Visit: Payer: BLUE CROSS/BLUE SHIELD | Admitting: Physical Therapy

## 2016-09-29 ENCOUNTER — Ambulatory Visit: Payer: BLUE CROSS/BLUE SHIELD | Admitting: Physical Therapy

## 2016-10-01 ENCOUNTER — Encounter: Payer: BLUE CROSS/BLUE SHIELD | Admitting: Physical Therapy

## 2016-10-06 ENCOUNTER — Encounter: Payer: BLUE CROSS/BLUE SHIELD | Admitting: Physical Therapy

## 2016-10-08 ENCOUNTER — Encounter: Payer: BLUE CROSS/BLUE SHIELD | Admitting: Physical Therapy

## 2016-11-05 DIAGNOSIS — Z1231 Encounter for screening mammogram for malignant neoplasm of breast: Secondary | ICD-10-CM | POA: Diagnosis not present

## 2016-11-05 DIAGNOSIS — Z01419 Encounter for gynecological examination (general) (routine) without abnormal findings: Secondary | ICD-10-CM | POA: Diagnosis not present

## 2016-11-05 DIAGNOSIS — N912 Amenorrhea, unspecified: Secondary | ICD-10-CM | POA: Diagnosis not present

## 2016-11-05 DIAGNOSIS — Z6825 Body mass index (BMI) 25.0-25.9, adult: Secondary | ICD-10-CM | POA: Diagnosis not present

## 2016-12-08 DIAGNOSIS — M8589 Other specified disorders of bone density and structure, multiple sites: Secondary | ICD-10-CM | POA: Diagnosis not present

## 2016-12-08 LAB — HM DEXA SCAN

## 2016-12-31 DIAGNOSIS — L7211 Pilar cyst: Secondary | ICD-10-CM | POA: Diagnosis not present

## 2016-12-31 DIAGNOSIS — Z86018 Personal history of other benign neoplasm: Secondary | ICD-10-CM | POA: Diagnosis not present

## 2016-12-31 DIAGNOSIS — L578 Other skin changes due to chronic exposure to nonionizing radiation: Secondary | ICD-10-CM | POA: Diagnosis not present

## 2016-12-31 DIAGNOSIS — D485 Neoplasm of uncertain behavior of skin: Secondary | ICD-10-CM | POA: Diagnosis not present

## 2017-01-20 DIAGNOSIS — R300 Dysuria: Secondary | ICD-10-CM | POA: Diagnosis not present

## 2017-01-20 DIAGNOSIS — N3090 Cystitis, unspecified without hematuria: Secondary | ICD-10-CM | POA: Diagnosis not present

## 2017-02-02 DIAGNOSIS — D485 Neoplasm of uncertain behavior of skin: Secondary | ICD-10-CM | POA: Diagnosis not present

## 2017-03-02 DIAGNOSIS — D229 Melanocytic nevi, unspecified: Secondary | ICD-10-CM | POA: Diagnosis not present

## 2017-03-02 DIAGNOSIS — D485 Neoplasm of uncertain behavior of skin: Secondary | ICD-10-CM | POA: Diagnosis not present

## 2017-06-01 ENCOUNTER — Ambulatory Visit: Payer: BLUE CROSS/BLUE SHIELD | Admitting: Family Medicine

## 2017-06-01 ENCOUNTER — Encounter: Payer: Self-pay | Admitting: Family Medicine

## 2017-06-01 VITALS — BP 122/80 | HR 80 | Temp 98.2°F | Resp 16

## 2017-06-01 DIAGNOSIS — M5442 Lumbago with sciatica, left side: Secondary | ICD-10-CM

## 2017-06-01 DIAGNOSIS — M546 Pain in thoracic spine: Secondary | ICD-10-CM | POA: Diagnosis not present

## 2017-06-01 DIAGNOSIS — R1013 Epigastric pain: Secondary | ICD-10-CM | POA: Diagnosis not present

## 2017-06-01 DIAGNOSIS — G8929 Other chronic pain: Secondary | ICD-10-CM

## 2017-06-01 DIAGNOSIS — E785 Hyperlipidemia, unspecified: Secondary | ICD-10-CM

## 2017-06-01 MED ORDER — PREDNISONE 10 MG (21) PO TBPK: ORAL_TABLET | ORAL | 0 refills | Status: DC

## 2017-06-01 MED ORDER — CARISOPRODOL 350 MG PO TABS: 350.0000 mg | ORAL_TABLET | Freq: Four times a day (QID) | ORAL | 1 refills | Status: DC | PRN

## 2017-06-01 NOTE — Progress Notes (Signed)
Patient: Marcia Farmer Female    DOB: 1960-09-14   57 y.o.   MRN: [id number] Visit Date: 06/01/2017  Today's Provider: [name], MD   Chief Complaint  Patient presents with  . Back Pain   Subjective:    HPI Pt reports that she has back pain. This has been a on going problem for about 2-3 years. It has been gradually getting worse in the lat 6-8 months. She has had X-rays done and showed degenerative changes. She reports that the pain goes up her back and down her left leg. She reports that sometimes the pain gets so bad that she cries. Pt would like to see about getting a MRI and possible referral if the MRI shows anything that a referral is needed for. She reports that she is tired all the time. The pain wakes her up at night.      Allergies  Allergen Reactions  . Nitrofurantoin Rash     Current Outpatient Medications:  .  ALPRAZolam (XANAX) 0.25 MG tablet, Take 1 tablet (0.25 mg total) by mouth every 8 (eight) hours as needed for anxiety., Disp: 90 tablet, Rfl: 5 .  ibuprofen (ADVIL,MOTRIN) 600 MG tablet, Take 600 mg by mouth every 6 (six) hours as needed., Disp: , Rfl:  .  Aspirin-Caffeine (BC FAST PAIN RELIEF PO), Take by mouth as needed., Disp: , Rfl:  .  cyclobenzaprine (FLEXERIL) 10 MG tablet, TAKE ONE TABLET BY MOUTH AT BEDTIME AS NEEDED FOR PAIN (Patient not taking: Reported on 08/13/2016), Disp: 30 tablet, Rfl: 5 .  meloxicam (MOBIC) 15 MG tablet, Take 1 tablet (15 mg total) by mouth daily. (Patient not taking: Reported on 06/01/2017), Disp: 30 tablet, Rfl: 2 .  naproxen (NAPROSYN) 500 MG tablet, TAKE ONE TABLET BY MOUTH TWICE DAILY AS NEEDED FOR PAIN (Patient not taking: Reported on 08/13/2016), Disp: 60 tablet, Rfl: 0  Review of Systems  Constitutional: Positive for fatigue.  HENT: Positive for congestion, postnasal drip, rhinorrhea, sinus pressure, sinus pain and sneezing.   Eyes: Negative.   Respiratory: Positive for cough.   Cardiovascular:  Negative.   Gastrointestinal: Negative.   Endocrine: Negative.   Genitourinary: Negative.   Musculoskeletal: Positive for back pain.       Low back pain into left leg/buttocks.Also some thoracic back pain on occasion.  Skin: Negative.   Allergic/Immunologic: Negative.   Neurological: Negative.   Hematological: Negative.   Psychiatric/Behavioral: Negative.     Social History   Tobacco Use  . Smoking status: Never Smoker  . Smokeless tobacco: Never Used  Substance Use Topics  . Alcohol use: Yes    Alcohol/week: 6.0 - 8.4 oz    Types: 10 - 14 Glasses of wine per week    Comment: 7-9oz of wine daily   Objective:   BP 122/80 (BP Location: Left Arm, Patient Position: Sitting, Cuff Size: Normal)   Pulse 80   Temp 98.2 F (36.8 C) (Oral)   Resp 16   SpO2 96%  Vitals:   06/01/17 1330  BP: 122/80  Pulse: 80  Resp: 16  Temp: 98.2 F (36.8 C)  TempSrc: Oral  SpO2: 96%     Physical Exam  Constitutional: She is oriented to person, place, and time. She appears well-developed and well-nourished.  HENT:  Head: Normocephalic and atraumatic.  Right Ear: External ear normal.  Left Ear: External ear normal.  Nose: Nose normal.  Eyes: No scleral icterus.  Neck: No thyromegaly present.  Cardiovascular:  Normal rate, regular rhythm and normal heart sounds.  Pulmonary/Chest: Effort normal and breath sounds normal.  Abdominal: Soft. She exhibits no distension and no mass. There is no tenderness.  Musculoskeletal: She exhibits no edema, tenderness or deformity.  Lymphadenopathy:    She has no cervical adenopathy.  Neurological: She is alert and oriented to person, place, and time. She displays normal reflexes. She exhibits normal muscle tone.  Normal exam of LE.  Skin: Skin is warm and dry.  Psychiatric: She has a normal mood and affect. Her behavior is normal. Judgment and thought content normal.        Assessment & Plan:     1. Chronic thoracic back pain, unspecified back  pain laterality   2. Epigastric pain  - CBC with Differential/Platelet - TSH - Lipase - US Abdomen Limited; Future  3. Chronic low back pain with left-sided sciatica, unspecified back pain laterality RTC 1-2 weeks.May need further w/u. - MR Lumbar Spine Wo Contrast; Future - predniSONE (STERAPRED UNI-PAK 21 TAB) 10 MG (21) TBPK tablet; Take 6 tablets on first day then decrease by 1 daily until finished  Dispense: 21 tablet; Refill: 0 - carisoprodol (SOMA) 350 MG tablet; Take 1 tablet (350 mg total) by mouth 4 (four) times daily as needed for muscle spasms.  Dispense: 60 tablet; Refill: 1  4. Hyperlipidemia, unspecified hyperlipidemia type  - Comprehensive metabolic panel - Lipid panel  I have done the exam and reviewed the chart and it is accurate to the best of my knowledge. Development worker, community has been used and  any errors in dictation or transcription are unintentional. Miguel Aschoff M.D. [place], MD  [facility]

## 2017-06-04 DIAGNOSIS — R1013 Epigastric pain: Secondary | ICD-10-CM | POA: Diagnosis not present

## 2017-06-04 DIAGNOSIS — E785 Hyperlipidemia, unspecified: Secondary | ICD-10-CM | POA: Diagnosis not present

## 2017-06-05 LAB — CBC WITH DIFFERENTIAL/PLATELET
BASOS ABS: 0 10*3/uL (ref 0.0–0.2)
Basos: 0 %
EOS (ABSOLUTE): 0 10*3/uL (ref 0.0–0.4)
EOS: 0 %
HEMATOCRIT: 39.2 % (ref 34.0–46.6)
HEMOGLOBIN: 12.8 g/dL (ref 11.1–15.9)
IMMATURE GRANULOCYTES: 0 %
Immature Grans (Abs): 0 10*3/uL (ref 0.0–0.1)
Lymphocytes Absolute: 2.4 10*3/uL (ref 0.7–3.1)
Lymphs: 18 %
MCH: 30.5 pg (ref 26.6–33.0)
MCHC: 32.7 g/dL (ref 31.5–35.7)
MCV: 93 fL (ref 79–97)
MONOCYTES: 6 %
Monocytes Absolute: 0.8 10*3/uL (ref 0.1–0.9)
NEUTROS PCT: 76 %
Neutrophils Absolute: 10.4 10*3/uL — ABNORMAL HIGH (ref 1.4–7.0)
Platelets: 311 10*3/uL (ref 150–379)
RBC: 4.2 x10E6/uL (ref 3.77–5.28)
RDW: 13.4 % (ref 12.3–15.4)
WBC: 13.7 10*3/uL — ABNORMAL HIGH (ref 3.4–10.8)

## 2017-06-05 LAB — COMPREHENSIVE METABOLIC PANEL
ALBUMIN: 4.8 g/dL (ref 3.5–5.5)
ALT: 45 IU/L — ABNORMAL HIGH (ref 0–32)
AST: 22 IU/L (ref 0–40)
Albumin/Globulin Ratio: 2.2 (ref 1.2–2.2)
Alkaline Phosphatase: 143 IU/L — ABNORMAL HIGH (ref 39–117)
BUN/Creatinine Ratio: 18 (ref 9–23)
BUN: 13 mg/dL (ref 6–24)
Bilirubin Total: 0.3 mg/dL (ref 0.0–1.2)
CALCIUM: 9.9 mg/dL (ref 8.7–10.2)
CO2: 24 mmol/L (ref 20–29)
CREATININE: 0.71 mg/dL (ref 0.57–1.00)
Chloride: 102 mmol/L (ref 96–106)
GFR calc Af Amer: 112 mL/min/{1.73_m2} (ref >59)
GFR, EST NON AFRICAN AMERICAN: 97 mL/min/{1.73_m2} (ref >59)
GLUCOSE: 103 mg/dL — AB (ref 65–99)
Globulin, Total: 2.2 g/dL (ref 1.5–4.5)
Potassium: 4.3 mmol/L (ref 3.5–5.2)
SODIUM: 143 mmol/L (ref 134–144)
Total Protein: 7 g/dL (ref 6.0–8.5)

## 2017-06-05 LAB — LIPID PANEL
CHOLESTEROL TOTAL: 254 mg/dL — AB (ref 100–199)
Chol/HDL Ratio: 3.8 ratio (ref 0.0–4.4)
HDL: 67 mg/dL (ref >39)
LDL CALC: 171 mg/dL — AB (ref 0–99)
Triglycerides: 78 mg/dL (ref 0–149)
VLDL CHOLESTEROL CAL: 16 mg/dL (ref 5–40)

## 2017-06-05 LAB — LIPASE: Lipase: 35 U/L (ref 14–72)

## 2017-06-05 LAB — TSH: TSH: 0.937 u[IU]/mL (ref 0.450–4.500)

## 2017-06-11 ENCOUNTER — Telehealth: Payer: Self-pay | Admitting: Family Medicine

## 2017-06-11 DIAGNOSIS — R1013 Epigastric pain: Secondary | ICD-10-CM

## 2017-06-11 NOTE — Telephone Encounter (Signed)
Please advise 

## 2017-06-11 NOTE — Telephone Encounter (Signed)
Done

## 2017-06-11 NOTE — Telephone Encounter (Signed)
Complete

## 2017-06-11 NOTE — Telephone Encounter (Signed)
Per ultrasound department at [facility] abdominal ultrasound needs to be changed from limited to either RUQ or complete abdominal ultrasound

## 2017-06-12 ENCOUNTER — Ambulatory Visit
Admission: RE | Admit: 2017-06-12 | Discharge: 2017-06-12 | Disposition: A | Discharge status: Home / Self Care | Payer: BLUE CROSS/BLUE SHIELD | Source: Ambulatory Visit | Attending: Family Medicine | Admitting: Family Medicine

## 2017-06-12 DIAGNOSIS — R1013 Epigastric pain: Secondary | ICD-10-CM | POA: Insufficient documentation

## 2017-06-12 DIAGNOSIS — G8929 Other chronic pain: Secondary | ICD-10-CM | POA: Insufficient documentation

## 2017-06-12 DIAGNOSIS — Z9049 Acquired absence of other specified parts of digestive tract: Secondary | ICD-10-CM | POA: Insufficient documentation

## 2017-06-12 DIAGNOSIS — M5137 Other intervertebral disc degeneration, lumbosacral region: Secondary | ICD-10-CM | POA: Diagnosis not present

## 2017-06-12 DIAGNOSIS — M5442 Lumbago with sciatica, left side: Secondary | ICD-10-CM | POA: Insufficient documentation

## 2017-06-12 DIAGNOSIS — M545 Low back pain: Secondary | ICD-10-CM | POA: Diagnosis not present

## 2017-06-15 ENCOUNTER — Telehealth: Payer: Self-pay | Admitting: Emergency Medicine

## 2017-06-15 NOTE — Telephone Encounter (Signed)
Pt advised.

## 2017-06-15 NOTE — Telephone Encounter (Signed)
LMTCB, see other message also.

## 2017-06-15 NOTE — Telephone Encounter (Signed)
-----   Message from Jerrol Banana., MD sent at 06/12/2017  3:32 PM EDT ----- MRI shows some DDD only.

## 2017-06-15 NOTE — Telephone Encounter (Signed)
-----   Message from Jerrol Banana., MD sent at 06/12/2017  3:33 PM EDT ----- [place] ok--some fatty liver--D and E.

## 2017-06-17 ENCOUNTER — Ambulatory Visit: Payer: BLUE CROSS/BLUE SHIELD | Admitting: Family Medicine

## 2017-06-17 ENCOUNTER — Encounter: Payer: Self-pay | Admitting: Family Medicine

## 2017-06-17 VITALS — BP 124/74 | HR 88 | Temp 98.8°F | Resp 14 | Wt 170.0 lb

## 2017-06-17 DIAGNOSIS — M546 Pain in thoracic spine: Secondary | ICD-10-CM | POA: Diagnosis not present

## 2017-06-17 DIAGNOSIS — G8929 Other chronic pain: Secondary | ICD-10-CM

## 2017-06-17 DIAGNOSIS — M5442 Lumbago with sciatica, left side: Secondary | ICD-10-CM

## 2017-06-17 NOTE — Progress Notes (Signed)
Patient: Marcia Farmer Female    DOB: [birth year]   57 y.o.   MRN: [id number] Visit Date: 06/17/2017  Today's Provider: [name], MD   Chief Complaint  Patient presents with  . Follow-up    back pain and epigastric pain   Subjective:    HPI  Pt is here today for a follow up of back pain and epigastric pain. Her last OV was on 06/01/17. We checked CBC, TSH, Lipase, u/s Abd. And MRI back. Started her on prednisone and Soma and rechecked in 2 weeks.  MRI showed DDD and U/S showed fatty liver otherwise ok. Labs, ok. Pt reports that she is feeling about the same, however today she is having a good day. She thinks the prednisone helped take the edge off some. She has not taken the Soma in 4-5 days.      Allergies  Allergen Reactions  . Nitrofurantoin Rash     Current Outpatient Medications:  .  ALPRAZolam (XANAX) 0.25 MG tablet, Take 1 tablet (0.25 mg total) by mouth every 8 (eight) hours as needed for anxiety., Disp: 90 tablet, Rfl: 5 .  carisoprodol (SOMA) 350 MG tablet, Take 1 tablet (350 mg total) by mouth 4 (four) times daily as needed for muscle spasms., Disp: 60 tablet, Rfl: 1 .  Aspirin-Caffeine (BC FAST PAIN RELIEF PO), Take by mouth as needed., Disp: , Rfl:  .  cyclobenzaprine (FLEXERIL) 10 MG tablet, TAKE ONE TABLET BY MOUTH AT BEDTIME AS NEEDED FOR PAIN (Patient not taking: Reported on 08/13/2016), Disp: 30 tablet, Rfl: 5 .  ibuprofen (ADVIL,MOTRIN) 600 MG tablet, Take 600 mg by mouth every 6 (six) hours as needed., Disp: , Rfl:  .  meloxicam (MOBIC) 15 MG tablet, Take 1 tablet (15 mg total) by mouth daily. (Patient not taking: Reported on 06/01/2017), Disp: 30 tablet, Rfl: 2 .  naproxen (NAPROSYN) 500 MG tablet, TAKE ONE TABLET BY MOUTH TWICE DAILY AS NEEDED FOR PAIN (Patient not taking: Reported on 08/13/2016), Disp: 60 tablet, Rfl: 0 .  predniSONE (STERAPRED UNI-PAK 21 TAB) 10 MG (21) TBPK tablet, Take 6 tablets on first day then decrease by 1 daily until  finished (Patient not taking: Reported on 06/17/2017), Disp: 21 tablet, Rfl: 0  Review of Systems  Constitutional: Negative.   HENT: Negative.   Eyes: Negative.   Respiratory: Negative.   Cardiovascular: Negative.   Gastrointestinal: Negative.   Endocrine: Negative.   Genitourinary: Negative.   Musculoskeletal: Positive for back pain.  Skin: Negative.   Allergic/Immunologic: Negative.   Neurological: Negative.   Hematological: Negative.   Psychiatric/Behavioral: Negative.     Social History   Tobacco Use  . Smoking status: Never Smoker  . Smokeless tobacco: Never Used  Substance Use Topics  . Alcohol use: Yes    Alcohol/week: 6.0 - 8.4 oz    Types: 10 - 14 Glasses of wine per week    Comment: 7-9oz of wine daily   Objective:   BP 124/74 (BP Location: Left Arm, Patient Position: Sitting, Cuff Size: Normal)   Pulse 88   Temp 98.8 F (37.1 C) (Oral)   Resp 14   Wt 170 lb (77.1 kg)   BMI 25.85 kg/m  Vitals:   06/17/17 1328  BP: 124/74  Pulse: 88  Resp: 14  Temp: 98.8 F (37.1 C)  TempSrc: Oral  Weight: 170 lb (77.1 kg)     Physical Exam  Constitutional: She is oriented to person, place, and time. She  appears well-developed and well-nourished.  HENT:  Head: Normocephalic and atraumatic.  Eyes: Pupils are equal, round, and reactive to light. Conjunctivae and EOM are normal.  Neck: Normal range of motion. Neck supple. No thyromegaly present.  Cardiovascular: Normal rate, regular rhythm, normal heart sounds and intact distal pulses.  Pulmonary/Chest: Effort normal and breath sounds normal.  Abdominal: Soft. Bowel sounds are normal.  Musculoskeletal: Normal range of motion.  Neurological: She is alert and oriented to person, place, and time. She has normal reflexes.  Skin: Skin is warm and dry.  Psychiatric: She has a normal mood and affect. Her behavior is normal. Judgment and thought content normal.        Assessment & Plan:     1. Chronic thoracic back  pain, unspecified back pain laterality  - Ambulatory referral to Pain Clinic - Ambulatory referral to Physical Therapy  2. Chronic low back pain with left-sided sciatica, unspecified back pain laterality PT first. - Ambulatory referral to Pain Clinic - Ambulatory referral to Physical Therapy     HPI, Exam, and A&P Transcribed under the direction and in the presence of Richard L. Cranford Mon, MD  Electronically Signed: [name], CMA  I have done the exam and reviewed the above chart and it is accurate to the best of my knowledge. Development worker, community has been used in this note in any air is in the dictation or transcription are unintentional.  [name], MD  [facility] [place]

## 2017-06-24 DIAGNOSIS — M545 Low back pain: Secondary | ICD-10-CM | POA: Diagnosis not present

## 2017-06-29 DIAGNOSIS — M545 Low back pain: Secondary | ICD-10-CM | POA: Diagnosis not present

## 2017-07-02 DIAGNOSIS — M545 Low back pain: Secondary | ICD-10-CM | POA: Diagnosis not present

## 2017-07-07 DIAGNOSIS — M545 Low back pain: Secondary | ICD-10-CM | POA: Diagnosis not present

## 2017-07-08 DIAGNOSIS — H5203 Hypermetropia, bilateral: Secondary | ICD-10-CM | POA: Diagnosis not present

## 2017-07-09 DIAGNOSIS — M545 Low back pain: Secondary | ICD-10-CM | POA: Diagnosis not present

## 2017-07-15 DIAGNOSIS — M545 Low back pain: Secondary | ICD-10-CM | POA: Diagnosis not present

## 2017-08-20 ENCOUNTER — Encounter: Payer: Self-pay | Admitting: Family Medicine

## 2017-09-01 ENCOUNTER — Ambulatory Visit (INDEPENDENT_AMBULATORY_CARE_PROVIDER_SITE_OTHER): Payer: BLUE CROSS/BLUE SHIELD | Admitting: Family Medicine

## 2017-09-01 ENCOUNTER — Encounter: Payer: Self-pay | Admitting: Family Medicine

## 2017-09-01 VITALS — BP 122/74 | HR 72 | Temp 98.5°F | Resp 16 | Ht 68.0 in | Wt 167.0 lb

## 2017-09-01 DIAGNOSIS — M545 Low back pain, unspecified: Secondary | ICD-10-CM

## 2017-09-01 NOTE — Progress Notes (Signed)
Patient: Marcia Farmer Female    DOB: 1960/06/17   57 y.o.   MRN: [id number] Visit Date: 09/01/2017  Today's Provider: [name], MD   Chief Complaint  Patient presents with  . Pain   Subjective:    HPI  Patient comes in today for a follow up. She feels well today with no complaints.   She currently takes [place] 350mg  daily for chronic pain. She also takes Meloxicam 15mg  daily for pain.     Allergies  Allergen Reactions  . Nitrofurantoin Rash     Current Outpatient Medications:  .  ALPRAZolam (XANAX) 0.25 MG tablet, Take 1 tablet (0.25 mg total) by mouth every 8 (eight) hours as needed for anxiety., Disp: 90 tablet, Rfl: 5 .  Aspirin-Caffeine (BC FAST PAIN RELIEF PO), Take by mouth as needed., Disp: , Rfl:  .  carisoprodol (SOMA) 350 MG tablet, Take 1 tablet (350 mg total) by mouth 4 (four) times daily as needed for muscle spasms., Disp: 60 tablet, Rfl: 1 .  ibuprofen (ADVIL,MOTRIN) 600 MG tablet, Take 600 mg by mouth every 6 (six) hours as needed., Disp: , Rfl:  .  meloxicam (MOBIC) 15 MG tablet, Take 1 tablet (15 mg total) by mouth daily., Disp: 30 tablet, Rfl: 2 .  cyclobenzaprine (FLEXERIL) 10 MG tablet, TAKE ONE TABLET BY MOUTH AT BEDTIME AS NEEDED FOR PAIN (Patient not taking: Reported on 08/13/2016), Disp: 30 tablet, Rfl: 5 .  naproxen (NAPROSYN) 500 MG tablet, TAKE ONE TABLET BY MOUTH TWICE DAILY AS NEEDED FOR PAIN (Patient not taking: Reported on 08/13/2016), Disp: 60 tablet, Rfl: 0 .  predniSONE (STERAPRED UNI-PAK 21 TAB) 10 MG (21) TBPK tablet, Take 6 tablets on first day then decrease by 1 daily until finished (Patient not taking: Reported on 06/17/2017), Disp: 21 tablet, Rfl: 0  Review of Systems  Constitutional: Negative.   Respiratory: Negative.   Cardiovascular: Negative.   Endocrine: Negative.   Genitourinary: Negative.   Musculoskeletal: Positive for arthralgias, back pain and myalgias.  Allergic/Immunologic: Negative.   Neurological:  Negative.   Psychiatric/Behavioral: Negative.     Social History   Tobacco Use  . Smoking status: Never Smoker  . Smokeless tobacco: Never Used  Substance Use Topics  . Alcohol use: Yes    Alcohol/week: 6.0 - 8.4 oz    Types: 10 - 14 Glasses of wine per week    Comment: 7-9oz of wine daily   Objective:   BP 122/74 (BP Location: Right Arm, Patient Position: Sitting, Cuff Size: Normal)   Pulse 72   Temp 98.5 F (36.9 C)   Resp 16   Ht 5\' 8"  (1.727 m)   Wt 167 lb (75.8 kg)   SpO2 98%   BMI 25.39 kg/m  Vitals:   09/01/17 1048  BP: 122/74  Pulse: 72  Resp: 16  Temp: 98.5 F (36.9 C)  SpO2: 98%  Weight: 167 lb (75.8 kg)  Height: 5\' 8"  (1.727 m)     Physical Exam  Constitutional: She is oriented to person, place, and time. She appears well-developed and well-nourished.  HENT:  Head: Normocephalic and atraumatic.  Right Ear: External ear normal.  Left Ear: External ear normal.  Nose: Nose normal.  Eyes: Conjunctivae are normal. No scleral icterus.  Neck: No thyromegaly present.  Cardiovascular: Normal rate, regular rhythm and normal heart sounds.  Pulmonary/Chest: Effort normal and breath sounds normal.  Abdominal: Soft.  Neurological: She is alert and oriented to person, place, and  time.  Skin: Skin is warm and dry.  Psychiatric: She has a normal mood and affect. Her behavior is normal. Judgment and thought content normal.        Assessment & Plan:     Chronic Back Pain Improved. Use prn Mobic.Further w/u for this if it worsens. cPE later this year.    I have done the exam and reviewed the above chart and it is accurate to the best of my knowledge. Development worker, community has been used in this note in any air is in the dictation or transcription are unintentional.  [name], MD  [address]

## 2018-03-02 DIAGNOSIS — Z01419 Encounter for gynecological examination (general) (routine) without abnormal findings: Secondary | ICD-10-CM | POA: Diagnosis not present

## 2018-03-02 DIAGNOSIS — Z6825 Body mass index (BMI) 25.0-25.9, adult: Secondary | ICD-10-CM | POA: Diagnosis not present

## 2018-03-02 DIAGNOSIS — Z1231 Encounter for screening mammogram for malignant neoplasm of breast: Secondary | ICD-10-CM | POA: Diagnosis not present

## 2018-03-03 ENCOUNTER — Encounter: Payer: Self-pay | Admitting: Family Medicine

## 2018-03-03 ENCOUNTER — Ambulatory Visit: Payer: BLUE CROSS/BLUE SHIELD | Admitting: Family Medicine

## 2018-03-03 VITALS — BP 128/76 | HR 80 | Temp 98.6°F | Resp 16 | Ht 68.0 in | Wt 167.0 lb

## 2018-03-03 DIAGNOSIS — K635 Polyp of colon: Secondary | ICD-10-CM

## 2018-03-03 DIAGNOSIS — M545 Low back pain, unspecified: Secondary | ICD-10-CM

## 2018-03-03 DIAGNOSIS — M79671 Pain in right foot: Secondary | ICD-10-CM | POA: Diagnosis not present

## 2018-03-03 MED ORDER — PREDNISONE 10 MG PO TABS: 10.0000 mg | ORAL_TABLET | Freq: Every day | ORAL | 0 refills | Status: DC

## 2018-03-03 NOTE — Progress Notes (Signed)
Patient: Marcia Farmer Female    DOB: [birth year]   57 y.o.   MRN: [id number] Visit Date: 03/03/2018  Today's Provider: [name], MD   Chief Complaint  Patient presents with  . Follow-up   Subjective:     HPI  Patient comes in today for a follow up. She was last seen in the office 6 months ago. No changes were made in her medications.   She also mentions that she has pain in her right foot. She has had symptoms for 4-5 months. She reports that she has a burning sensation with occasional shooting pain. She has not taken anything for her symptoms.    Allergies  Allergen Reactions  . Nitrofurantoin Rash     Current Outpatient Medications:  .  ALPRAZolam (XANAX) 0.25 MG tablet, Take 1 tablet (0.25 mg total) by mouth every 8 (eight) hours as needed for anxiety., Disp: 90 tablet, Rfl: 5 .  Aspirin-Caffeine (BC FAST PAIN RELIEF PO), Take by mouth as needed., Disp: , Rfl:  .  carisoprodol (SOMA) 350 MG tablet, Take 1 tablet (350 mg total) by mouth 4 (four) times daily as needed for muscle spasms., Disp: 60 tablet, Rfl: 1 .  cyclobenzaprine (FLEXERIL) 10 MG tablet, TAKE ONE TABLET BY MOUTH AT BEDTIME AS NEEDED FOR PAIN (Patient not taking: Reported on 08/13/2016), Disp: 30 tablet, Rfl: 5 .  ibuprofen (ADVIL,MOTRIN) 600 MG tablet, Take 600 mg by mouth every 6 (six) hours as needed., Disp: , Rfl:  .  meloxicam (MOBIC) 15 MG tablet, Take 1 tablet (15 mg total) by mouth daily. (Patient not taking: Reported on 03/03/2018), Disp: 30 tablet, Rfl: 2 .  naproxen (NAPROSYN) 500 MG tablet, TAKE ONE TABLET BY MOUTH TWICE DAILY AS NEEDED FOR PAIN (Patient not taking: Reported on 08/13/2016), Disp: 60 tablet, Rfl: 0 .  predniSONE (STERAPRED UNI-PAK 21 TAB) 10 MG (21) TBPK tablet, Take 6 tablets on first day then decrease by 1 daily until finished (Patient not taking: Reported on 06/17/2017), Disp: 21 tablet, Rfl: 0  Review of Systems  Constitutional: Positive for fatigue.  HENT:  Positive for congestion, postnasal drip, rhinorrhea, sinus pressure, sinus pain and sneezing.   Eyes: Negative.   Cardiovascular: Negative.   Gastrointestinal: Negative.   Endocrine: Negative.   Genitourinary: Negative.   Musculoskeletal: Positive for arthralgias.       Right lateral foot pain.No known trauma.  Skin: Negative.   Allergic/Immunologic: Negative.   Neurological: Negative.   Hematological: Negative.   Psychiatric/Behavioral: Negative.     Social History   Tobacco Use  . Smoking status: Never Smoker  . Smokeless tobacco: Never Used  Substance Use Topics  . Alcohol use: Yes    Alcohol/week: 10.0 - 14.0 standard drinks    Types: 10 - 14 Glasses of wine per week    Comment: 7-9oz of wine daily      Objective:   BP 128/76   Pulse 80   Temp 98.6 F (37 C)   Resp 16   Ht 5\' 8"  (1.727 m)   Wt 167 lb (75.8 kg)   SpO2 96%   BMI 25.39 kg/m  Vitals:   03/03/18 1118  BP: 128/76  Pulse: 80  Resp: 16  Temp: 98.6 F (37 C)  SpO2: 96%  Weight: 167 lb (75.8 kg)  Height: 5\' 8"  (1.727 m)     Physical Exam Constitutional:      Appearance: She is well-developed.  HENT:  Head: Normocephalic and atraumatic.     Right Ear: External ear normal.     Left Ear: External ear normal.     Nose: Nose normal.  Eyes:     General: No scleral icterus. Neck:     Thyroid: No thyromegaly.  Cardiovascular:     Rate and Rhythm: Normal rate and regular rhythm.     Heart sounds: Normal heart sounds.  Pulmonary:     Effort: Pulmonary effort is normal.     Breath sounds: Normal breath sounds.  Abdominal:     General: There is no distension.     Palpations: Abdomen is soft. There is no mass.     Tenderness: There is no abdominal tenderness.  Musculoskeletal:        General: Tenderness present. No deformity.     Comments: No sweliing but pt is tender over right lateral foot at 5th metatarsal.  Lymphadenopathy:     Cervical: No cervical adenopathy.  Skin:    General:  Skin is warm and dry.  Neurological:     General: No focal deficit present.     Mental Status: She is alert and oriented to person, place, and time.     Motor: No abnormal muscle tone.     Deep Tendon Reflexes: Reflexes normal.     Comments: Normal exam of LE.  Psychiatric:        Behavior: Behavior normal.        Thought Content: Thought content normal.        Judgment: Judgment normal.         Assessment & Plan    1. Right foot pain  - Ambulatory referral to Podiatry - predniSONE (DELTASONE) 10 MG tablet; Take 1 tablet (10 mg total) by mouth daily with breakfast. Day 1 take 6 then decrease by 1 pill each day  Dispense: 21 tablet; Refill: 0  2. Sessile colonic polyp  - Ambulatory referral to Gastroenterology  3. Right-sided low back pain without sciatica, unspecified chronicity Much improved.    I have done the exam and reviewed the above chart and it is accurate to the best of my knowledge. Development worker, community has been used in this note in any air is in the dictation or transcription are unintentional.  [name], MD  [place]

## 2018-03-04 ENCOUNTER — Telehealth: Payer: Self-pay

## 2018-03-04 DIAGNOSIS — E785 Hyperlipidemia, unspecified: Secondary | ICD-10-CM

## 2018-03-04 NOTE — Telephone Encounter (Signed)
Patient called office stating that Dr. Rosanna Randy had abruptly left during her appointment and states that she was very confused by visit. Patient wanted to know if there is any labs that she needs to have drawn or if she was to be advised of anything that she needs to do? Patient request call back from [place] today to explain. KW

## 2018-03-04 NOTE — Telephone Encounter (Signed)
Left message to call back  

## 2018-03-04 NOTE — Telephone Encounter (Signed)
Referrals were placed and patient had normal labs in 05/2017? Patient feels she needs labs done (?). Please advise. Thanks!

## 2018-03-04 NOTE — Telephone Encounter (Signed)
Pt is returning missed call. Pt is waiting for a call back.  Thanks, [employer]

## 2018-03-04 NOTE — Telephone Encounter (Signed)
Yes to labs.

## 2018-03-04 NOTE — Telephone Encounter (Signed)
Patient is returning [place] call. KW

## 2018-03-08 NOTE — Telephone Encounter (Signed)
Patient assumed that her visit was supposed to be a physical. She did not realize that it was a follow up appt. Patient does have a physical scheduled with GYN also, but wanted to go ahead and have labs done by [place]. Will order routine labs as below.

## 2018-03-18 DIAGNOSIS — Z1231 Encounter for screening mammogram for malignant neoplasm of breast: Secondary | ICD-10-CM | POA: Diagnosis not present

## 2018-03-18 LAB — HM MAMMOGRAPHY

## 2018-03-19 DIAGNOSIS — E785 Hyperlipidemia, unspecified: Secondary | ICD-10-CM | POA: Diagnosis not present

## 2018-03-20 LAB — CBC WITH DIFFERENTIAL/PLATELET
BASOS: 1 %
Basophils Absolute: 0.1 10*3/uL (ref 0.0–0.2)
EOS (ABSOLUTE): 0.1 10*3/uL (ref 0.0–0.4)
EOS: 2 %
HEMATOCRIT: 41.1 % (ref 34.0–46.6)
HEMOGLOBIN: 14 g/dL (ref 11.1–15.9)
Immature Grans (Abs): 0 10*3/uL (ref 0.0–0.1)
Immature Granulocytes: 0 %
LYMPHS ABS: 1.7 10*3/uL (ref 0.7–3.1)
Lymphs: 36 %
MCH: 30.4 pg (ref 26.6–33.0)
MCHC: 34.1 g/dL (ref 31.5–35.7)
MCV: 89 fL (ref 79–97)
Monocytes Absolute: 0.6 10*3/uL (ref 0.1–0.9)
Monocytes: 13 %
NEUTROS ABS: 2.3 10*3/uL (ref 1.4–7.0)
Neutrophils: 48 %
Platelets: 240 10*3/uL (ref 150–450)
RBC: 4.61 x10E6/uL (ref 3.77–5.28)
RDW: 12.4 % (ref 12.3–15.4)
WBC: 4.8 10*3/uL (ref 3.4–10.8)

## 2018-03-20 LAB — COMPREHENSIVE METABOLIC PANEL
ALK PHOS: 102 IU/L (ref 39–117)
ALT: 24 IU/L (ref 0–32)
AST: 20 IU/L (ref 0–40)
Albumin/Globulin Ratio: 2.3 — ABNORMAL HIGH (ref 1.2–2.2)
Albumin: 4.5 g/dL (ref 3.5–5.5)
BUN / CREAT RATIO: 12 (ref 9–23)
BUN: 10 mg/dL (ref 6–24)
Bilirubin Total: 0.3 mg/dL (ref 0.0–1.2)
CALCIUM: 9.3 mg/dL (ref 8.7–10.2)
CO2: 22 mmol/L (ref 20–29)
CREATININE: 0.82 mg/dL (ref 0.57–1.00)
Chloride: 103 mmol/L (ref 96–106)
GFR calc Af Amer: 93 mL/min/{1.73_m2} (ref >59)
GFR calc non Af Amer: 81 mL/min/{1.73_m2} (ref >59)
GLOBULIN, TOTAL: 2 g/dL (ref 1.5–4.5)
Glucose: 105 mg/dL — ABNORMAL HIGH (ref 65–99)
POTASSIUM: 4.3 mmol/L (ref 3.5–5.2)
SODIUM: 141 mmol/L (ref 134–144)
Total Protein: 6.5 g/dL (ref 6.0–8.5)

## 2018-03-20 LAB — LIPID PANEL
Chol/HDL Ratio: 5.3 ratio — ABNORMAL HIGH (ref 0.0–4.4)
Cholesterol, Total: 270 mg/dL — ABNORMAL HIGH (ref 100–199)
HDL: 51 mg/dL (ref >39)
LDL CALC: 189 mg/dL — AB (ref 0–99)
Triglycerides: 152 mg/dL — ABNORMAL HIGH (ref 0–149)
VLDL CHOLESTEROL CAL: 30 mg/dL (ref 5–40)

## 2018-03-20 LAB — TSH: TSH: 2.29 u[IU]/mL (ref 0.450–4.500)

## 2018-03-22 DIAGNOSIS — M7752 Other enthesopathy of left foot: Secondary | ICD-10-CM | POA: Diagnosis not present

## 2018-03-22 DIAGNOSIS — M79671 Pain in right foot: Secondary | ICD-10-CM | POA: Diagnosis not present

## 2018-03-23 ENCOUNTER — Telehealth: Payer: Self-pay

## 2018-03-23 NOTE — Telephone Encounter (Signed)
-----   Message from Jerrol Banana., MD sent at 03/22/2018  3:08 PM EST ----- Labs okay but cholesterol little bit higher.  Work on left lifestyle

## 2018-03-23 NOTE — Telephone Encounter (Signed)
LVMTRC 

## 2018-03-23 NOTE — Telephone Encounter (Signed)
Pt returned missed call.  Please call pt back now.  She's able to answer her phone.  Thanks, [employer]

## 2018-03-23 NOTE — Telephone Encounter (Signed)
Patient was advised.  

## 2018-03-31 DIAGNOSIS — Z800 Family history of malignant neoplasm of digestive organs: Secondary | ICD-10-CM | POA: Diagnosis not present

## 2018-03-31 DIAGNOSIS — Z86010 Personal history of colonic polyps: Secondary | ICD-10-CM | POA: Diagnosis not present

## 2018-04-14 DIAGNOSIS — M79671 Pain in right foot: Secondary | ICD-10-CM | POA: Diagnosis not present

## 2018-04-14 DIAGNOSIS — M7751 Other enthesopathy of right foot: Secondary | ICD-10-CM | POA: Diagnosis not present

## 2018-05-04 ENCOUNTER — Encounter: Payer: Self-pay | Admitting: Family Medicine

## 2018-05-04 DIAGNOSIS — Z800 Family history of malignant neoplasm of digestive organs: Secondary | ICD-10-CM | POA: Diagnosis not present

## 2018-05-04 DIAGNOSIS — D120 Benign neoplasm of cecum: Secondary | ICD-10-CM | POA: Diagnosis not present

## 2018-05-04 DIAGNOSIS — K5730 Diverticulosis of large intestine without perforation or abscess without bleeding: Secondary | ICD-10-CM | POA: Diagnosis not present

## 2018-05-04 DIAGNOSIS — Z1211 Encounter for screening for malignant neoplasm of colon: Secondary | ICD-10-CM | POA: Diagnosis not present

## 2018-05-04 DIAGNOSIS — Z86010 Personal history of colonic polyps: Secondary | ICD-10-CM | POA: Diagnosis not present

## 2018-05-04 DIAGNOSIS — K635 Polyp of colon: Secondary | ICD-10-CM | POA: Diagnosis not present

## 2018-05-04 DIAGNOSIS — D125 Benign neoplasm of sigmoid colon: Secondary | ICD-10-CM | POA: Diagnosis not present

## 2018-05-13 DIAGNOSIS — D122 Benign neoplasm of ascending colon: Secondary | ICD-10-CM | POA: Diagnosis not present

## 2018-07-10 DIAGNOSIS — Z1159 Encounter for screening for other viral diseases: Secondary | ICD-10-CM | POA: Diagnosis not present

## 2018-07-10 DIAGNOSIS — Z01812 Encounter for preprocedural laboratory examination: Secondary | ICD-10-CM | POA: Diagnosis not present

## 2018-07-10 DIAGNOSIS — Z800 Family history of malignant neoplasm of digestive organs: Secondary | ICD-10-CM | POA: Diagnosis not present

## 2018-07-12 DIAGNOSIS — Z79899 Other long term (current) drug therapy: Secondary | ICD-10-CM | POA: Diagnosis not present

## 2018-07-12 DIAGNOSIS — D126 Benign neoplasm of colon, unspecified: Secondary | ICD-10-CM | POA: Diagnosis not present

## 2018-07-12 DIAGNOSIS — E785 Hyperlipidemia, unspecified: Secondary | ICD-10-CM | POA: Diagnosis not present

## 2018-07-12 DIAGNOSIS — K635 Polyp of colon: Secondary | ICD-10-CM | POA: Diagnosis not present

## 2018-07-12 DIAGNOSIS — K5730 Diverticulosis of large intestine without perforation or abscess without bleeding: Secondary | ICD-10-CM | POA: Diagnosis not present

## 2018-07-12 DIAGNOSIS — D122 Benign neoplasm of ascending colon: Secondary | ICD-10-CM | POA: Diagnosis not present

## 2018-07-12 DIAGNOSIS — D120 Benign neoplasm of cecum: Secondary | ICD-10-CM | POA: Diagnosis not present

## 2018-07-12 DIAGNOSIS — Z881 Allergy status to other antibiotic agents status: Secondary | ICD-10-CM | POA: Diagnosis not present

## 2018-07-12 DIAGNOSIS — Z791 Long term (current) use of non-steroidal anti-inflammatories (NSAID): Secondary | ICD-10-CM | POA: Diagnosis not present

## 2018-09-01 ENCOUNTER — Ambulatory Visit (INDEPENDENT_AMBULATORY_CARE_PROVIDER_SITE_OTHER): Payer: BC Managed Care – PPO | Admitting: Family Medicine

## 2018-09-01 ENCOUNTER — Encounter: Payer: Self-pay | Admitting: Family Medicine

## 2018-09-01 ENCOUNTER — Other Ambulatory Visit: Payer: Self-pay

## 2018-09-01 VITALS — BP 118/70 | HR 73 | Temp 98.7°F | Resp 18 | Wt 167.6 lb

## 2018-09-01 DIAGNOSIS — I471 Supraventricular tachycardia: Secondary | ICD-10-CM | POA: Diagnosis not present

## 2018-09-01 DIAGNOSIS — M545 Low back pain, unspecified: Secondary | ICD-10-CM

## 2018-09-01 NOTE — Progress Notes (Signed)
Patient: Marcia Farmer Female    DOB: 02-14-1961   57 y.o.   MRN: [id number] Visit Date: 09/01/2018  Today's Provider: [name], MD   Chief Complaint  Patient presents with  . Follow-up   Subjective:     HPI   6 Month follow up.   Allergies  Allergen Reactions  . Nitrofurantoin Rash     Current Outpatient Medications:  .  ALPRAZolam (XANAX) 0.25 MG tablet, Take 1 tablet (0.25 mg total) by mouth every 8 (eight) hours as needed for anxiety., Disp: 90 tablet, Rfl: 5 .  ibuprofen (ADVIL,MOTRIN) 600 MG tablet, Take 600 mg by mouth every 6 (six) hours as needed., Disp: , Rfl:  .  Aspirin-Caffeine (BC FAST PAIN RELIEF PO), Take by mouth as needed., Disp: , Rfl:  .  carisoprodol (SOMA) 350 MG tablet, Take 1 tablet (350 mg total) by mouth 4 (four) times daily as needed for muscle spasms. (Patient not taking: Reported on 09/01/2018), Disp: 60 tablet, Rfl: 1 .  cyclobenzaprine (FLEXERIL) 10 MG tablet, TAKE ONE TABLET BY MOUTH AT BEDTIME AS NEEDED FOR PAIN (Patient not taking: Reported on 08/13/2016), Disp: 30 tablet, Rfl: 5 .  meloxicam (MOBIC) 15 MG tablet, Take 1 tablet (15 mg total) by mouth daily. (Patient not taking: Reported on 03/03/2018), Disp: 30 tablet, Rfl: 2 .  naproxen (NAPROSYN) 500 MG tablet, TAKE ONE TABLET BY MOUTH TWICE DAILY AS NEEDED FOR PAIN (Patient not taking: Reported on 08/13/2016), Disp: 60 tablet, Rfl: 0 .  predniSONE (DELTASONE) 10 MG tablet, Take 1 tablet (10 mg total) by mouth daily with breakfast. Day 1 take 6 then decrease by 1 pill each day (Patient not taking: Reported on 09/01/2018), Disp: 21 tablet, Rfl: 0 .  predniSONE (STERAPRED UNI-PAK 21 TAB) 10 MG (21) TBPK tablet, Take 6 tablets on first day then decrease by 1 daily until finished (Patient not taking: Reported on 06/17/2017), Disp: 21 tablet, Rfl: 0  Review of Systems  All other systems reviewed and are negative.   Social History   Tobacco Use  . Smoking status: Never Smoker  .  Smokeless tobacco: Never Used  Substance Use Topics  . Alcohol use: Yes    Alcohol/week: 10.0 - 14.0 standard drinks    Types: 10 - 14 Glasses of wine per week    Comment: 7-9oz of wine daily      Objective:   BP 118/70 (BP Location: Right Arm, Patient Position: Sitting, Cuff Size: Normal)   Pulse 73   Temp 98.7 F (37.1 C) (Oral)   Resp 18   Wt 167 lb 9.6 oz (76 kg)   SpO2 99%   BMI 25.48 kg/m  Vitals:   09/01/18 1058  BP: 118/70  Pulse: 73  Resp: 18  Temp: 98.7 F (37.1 C)  TempSrc: Oral  SpO2: 99%  Weight: 167 lb 9.6 oz (76 kg)     Physical Exam Vitals signs reviewed.  Constitutional:      Appearance: She is well-developed.  HENT:     Head: Normocephalic and atraumatic.     Right Ear: External ear normal.     Left Ear: External ear normal.     Nose: Nose normal.  Eyes:     General: No scleral icterus. Neck:     Thyroid: No thyromegaly.  Cardiovascular:     Rate and Rhythm: Normal rate and regular rhythm.     Heart sounds: Normal heart sounds.  Pulmonary:  Effort: Pulmonary effort is normal.     Breath sounds: Normal breath sounds.  Abdominal:     General: There is no distension.     Palpations: Abdomen is soft. There is no mass.     Tenderness: There is no abdominal tenderness.  Musculoskeletal:        General: No deformity.  Lymphadenopathy:     Cervical: No cervical adenopathy.  Skin:    General: Skin is warm and dry.  Neurological:     General: No focal deficit present.     Mental Status: She is alert and oriented to person, place, and time.     Motor: No abnormal muscle tone.     Deep Tendon Reflexes: Reflexes normal.  Psychiatric:        Behavior: Behavior normal.        Thought Content: Thought content normal.        Judgment: Judgment normal.         Assessment & Plan    1. Right-sided low back pain without sciatica, unspecified chronicity Improved/resolved.   2. Paroxysmal supraventricular tachycardia (HCC) Asymptomatic.     I have done the exam and reviewed the above chart and it is accurate to the best of my knowledge. Development worker, community has been used in this note in any air is in the dictation or transcription are unintentional.  [name], MD  [place]

## 2018-09-19 IMAGING — CR DG CHEST 2V
2 series · 2 of 2 positions shown · non-contrast
Comparison: 05/15/2014

CLINICAL DATA: Palpitations for several weeks.  Lightheadedness.

EXAM:
CHEST  2 VIEW

[chest pa]
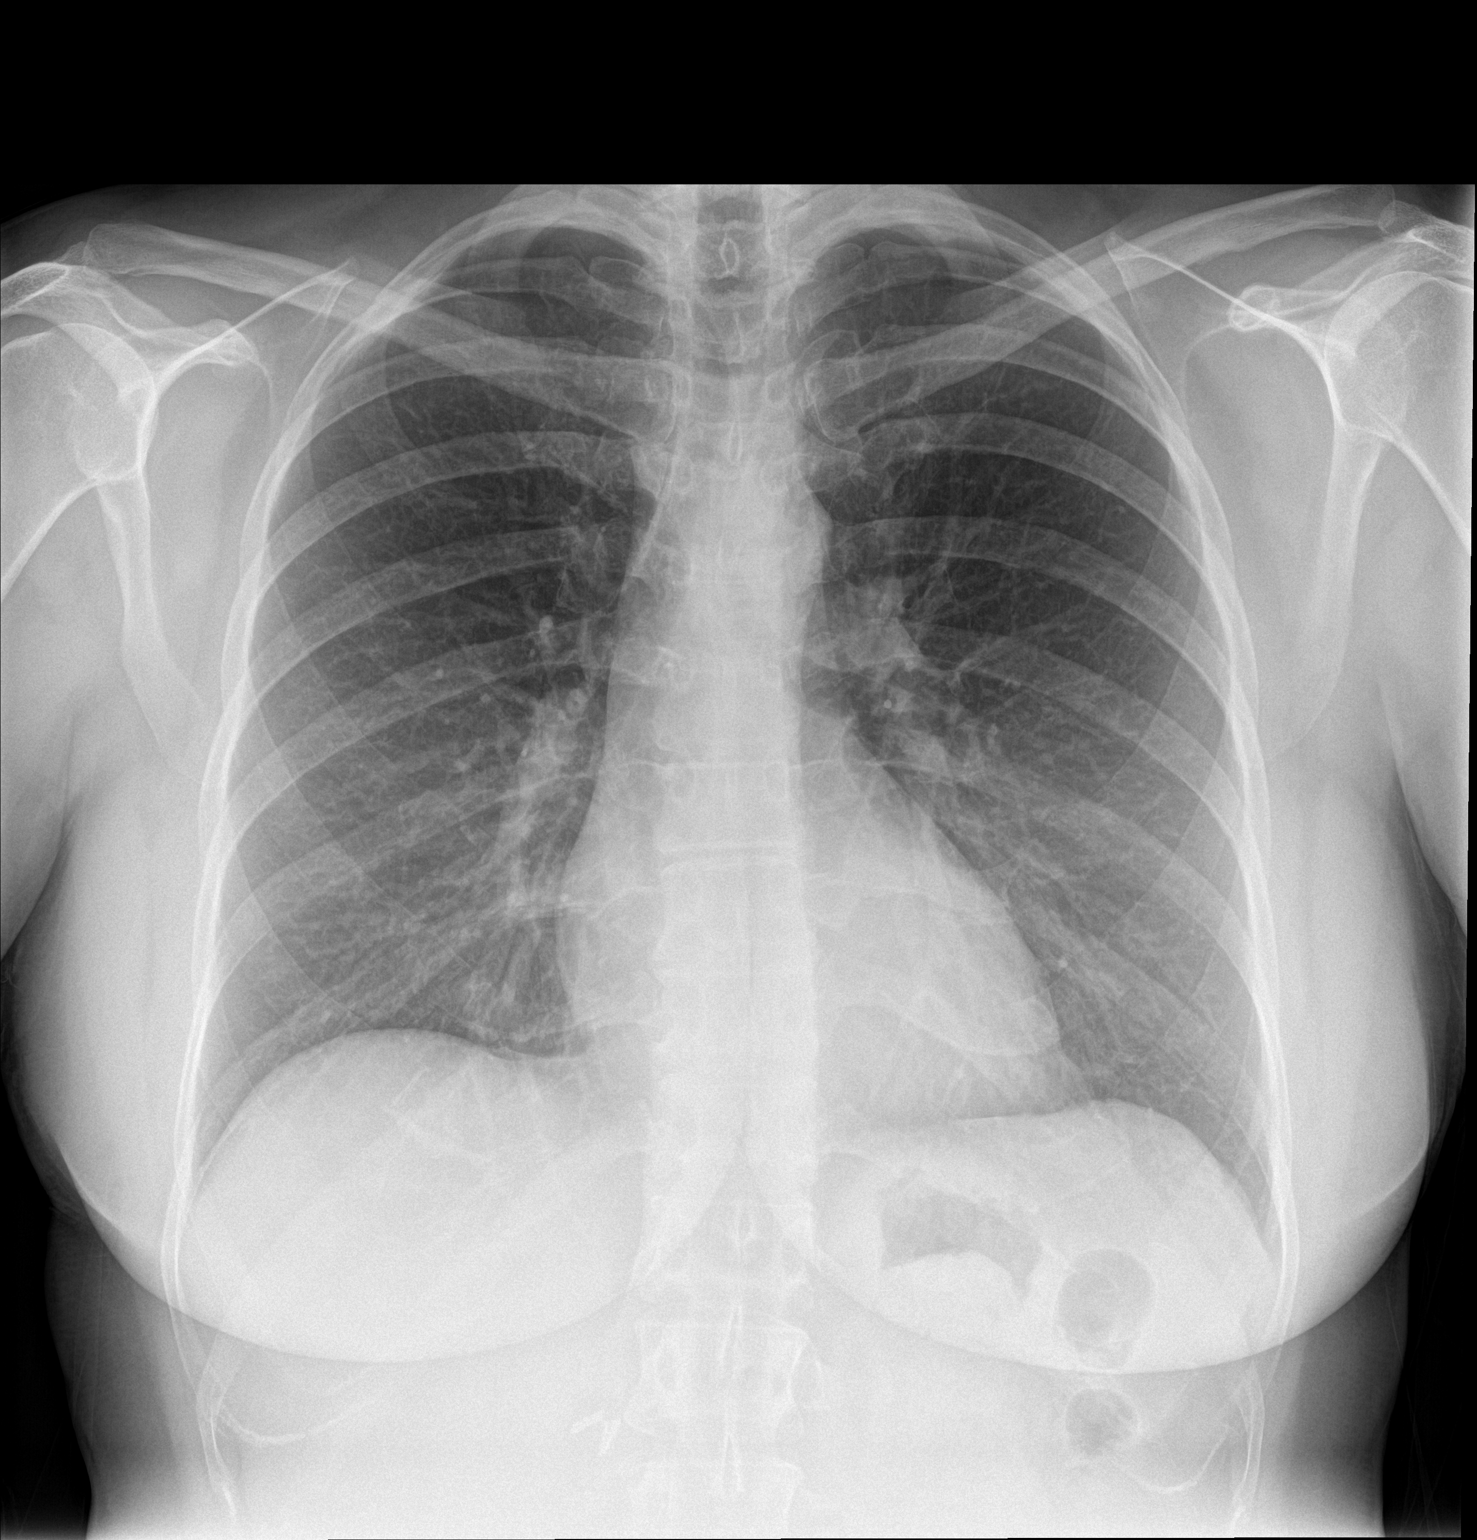

[chest lat]
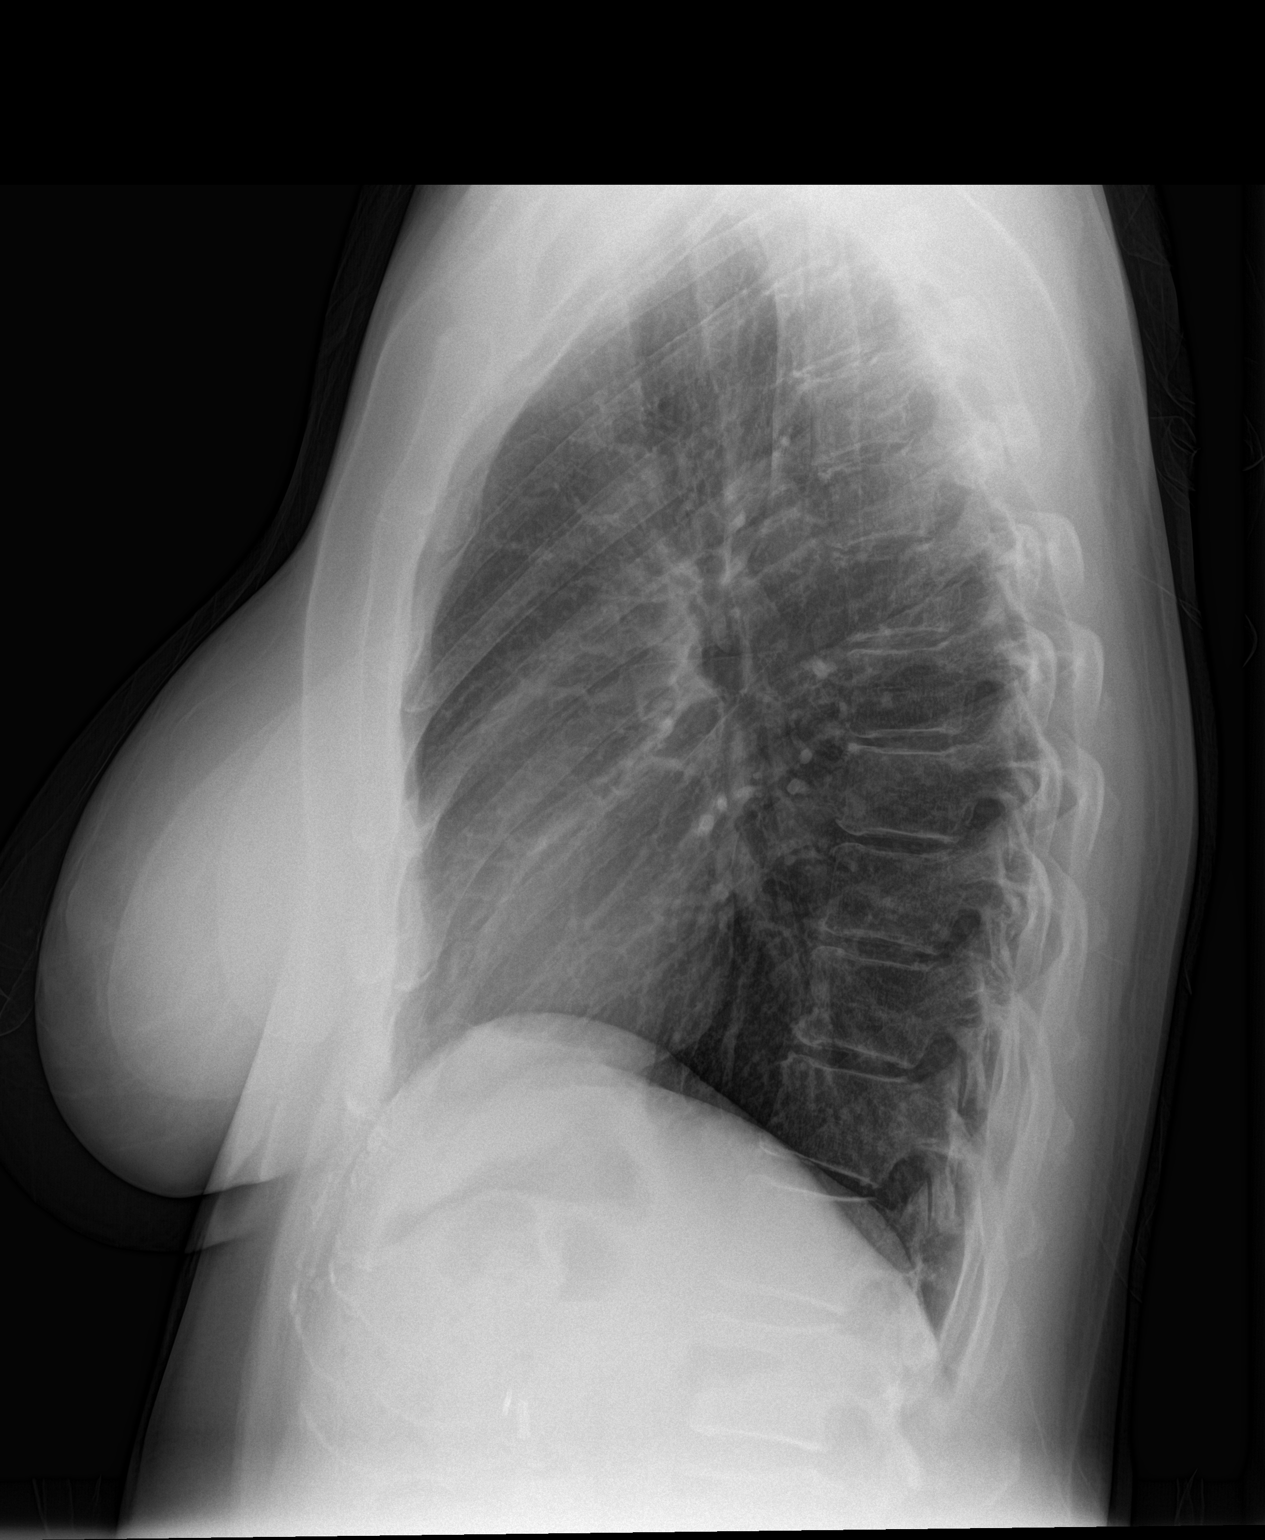

[2 of 2 positions shown; findings below may reference images not displayed]

FINDINGS: The heart size and mediastinal contours are within normal limits.
Both lungs are clear. The visualized skeletal structures are
unremarkable. Bilateral breast implants again noted.
IMPRESSION: Negative.  No active cardiopulmonary disease.

## 2019-01-28 DIAGNOSIS — Z20828 Contact with and (suspected) exposure to other viral communicable diseases: Secondary | ICD-10-CM | POA: Diagnosis not present

## 2019-01-28 DIAGNOSIS — Z01812 Encounter for preprocedural laboratory examination: Secondary | ICD-10-CM | POA: Diagnosis not present

## 2019-01-31 DIAGNOSIS — Z9049 Acquired absence of other specified parts of digestive tract: Secondary | ICD-10-CM | POA: Diagnosis not present

## 2019-01-31 DIAGNOSIS — Z9889 Other specified postprocedural states: Secondary | ICD-10-CM | POA: Diagnosis not present

## 2019-01-31 DIAGNOSIS — Z86010 Personal history of colonic polyps: Secondary | ICD-10-CM | POA: Diagnosis not present

## 2019-01-31 DIAGNOSIS — E782 Mixed hyperlipidemia: Secondary | ICD-10-CM | POA: Diagnosis not present

## 2019-01-31 DIAGNOSIS — Z791 Long term (current) use of non-steroidal anti-inflammatories (NSAID): Secondary | ICD-10-CM | POA: Diagnosis not present

## 2019-01-31 DIAGNOSIS — D120 Benign neoplasm of cecum: Secondary | ICD-10-CM | POA: Diagnosis not present

## 2019-01-31 DIAGNOSIS — K5730 Diverticulosis of large intestine without perforation or abscess without bleeding: Secondary | ICD-10-CM | POA: Diagnosis not present

## 2019-01-31 DIAGNOSIS — Z79899 Other long term (current) drug therapy: Secondary | ICD-10-CM | POA: Diagnosis not present

## 2019-01-31 DIAGNOSIS — Z1211 Encounter for screening for malignant neoplasm of colon: Secondary | ICD-10-CM | POA: Diagnosis not present

## 2019-01-31 DIAGNOSIS — K635 Polyp of colon: Secondary | ICD-10-CM | POA: Diagnosis not present

## 2019-01-31 LAB — HM COLONOSCOPY

## 2019-02-07 ENCOUNTER — Encounter: Payer: Self-pay | Admitting: *Deleted

## 2019-03-14 NOTE — Progress Notes (Signed)
Patient: Marcia Farmer, Female    DOB: 1960-10-12, 58 y.o.   MRN: [id number] Visit Date: 03/15/2019  Today's Provider: [name], MD   Chief Complaint  Patient presents with  . Annual Exam   Subjective:     Annual physical exam Marcia Farmer is a 58 y.o. female who presents today for health maintenance and complete physical. She feels well. 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 She reports she is sleeping fairly well. Sees [facility] for Gyn exam. Overall feels well. -----------------------------------------------------------------  Colonoscopy: 01/31/2019 Mammogram: 03/18/2018 Bi Rads 2 Benign  Review of Systems  Constitutional: Negative.   HENT: Negative.   Eyes: Negative.   Respiratory: Negative.   Cardiovascular: Negative.   Gastrointestinal: Negative.   Endocrine: Negative.   Genitourinary: Negative.   Musculoskeletal: Positive for back pain and myalgias.  Skin: Negative.   Allergic/Immunologic: Negative.   Neurological: Negative.   Hematological: Negative.   Psychiatric/Behavioral: Negative.     Social History      She  reports that she has never smoked. She has never used smokeless tobacco. She reports current alcohol use of about 10.0 - 14.0 standard drinks of alcohol per week. She reports that she does not use drugs.       Social History   Socioeconomic History  . Marital status: Married    Spouse name: Not on file  . Number of children: Not on file  . Years of education: Not on file  . Highest education level: Not on file  Occupational History  . Not on file  Tobacco Use  . Smoking status: Never Smoker  . Smokeless tobacco: Never Used  Substance and Sexual Activity  . Alcohol use: Yes    Alcohol/week: 10.0 - 14.0 standard drinks    Types: 10 - 14 Glasses of wine per week    Comment: 7-9oz of wine daily  . Drug use: No  . Sexual activity: Not on file  Other Topics Concern  . Not on file  Social History Narrative  .  Not on file   Social Determinants of Health   Financial Resource Strain:   . Difficulty of Paying Living Expenses: Not on file  Food Insecurity:   . Worried About Charity fundraiser in the Last Year: Not on file  . Ran Out of Food in the Last Year: Not on file  Transportation Needs:   . Lack of Transportation (Medical): Not on file  . Lack of Transportation (Non-Medical): Not on file  Physical Activity:   . Days of Exercise per Week: Not on file  . Minutes of Exercise per Session: Not on file  Stress:   . Feeling of Stress : Not on file  Social Connections:   . Frequency of Communication with Friends and Family: Not on file  . Frequency of Social Gatherings with Friends and Family: Not on file  . Attends Religious Services: Not on file  . Active Member of Clubs or Organizations: Not on file  . Attends Archivist Meetings: Not on file  . Marital Status: Not on file    Past Medical History:  Diagnosis Date  . Dysrhythmia   . Hyperlipidemia   . Mitral valve insufficiency   . Palpitations   . Tricuspid valve insufficiency      Patient Active Problem List   Diagnosis Date Noted  . Low back pain 04/23/2016  . 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 Family history of colon cancer 07/10/2015  .  Bradycardia 03/02/2014  . Cardiac murmur 02/28/2014  . Combined fat and carbohydrate induced hyperlipemia 02/28/2014  . Paroxysmal supraventricular tachycardia ([place]) 02/28/2014  . Cholelithiasis and cholecystitis without obstruction 10/05/2006    Past Surgical History:  Procedure Laterality Date  . ABDOMINAL HYSTERECTOMY    . CHOLECYSTECTOMY    . COLONOSCOPY    . COLONOSCOPY WITH PROPOFOL N/A 03/03/2016   Procedure: COLONOSCOPY WITH PROPOFOL;  Surgeon: Manya Silvas, MD;  Location: [facility] ENDOSCOPY;  Service: Endoscopy;  Laterality: N/A;  . ESOPHAGOGASTRODUODENOSCOPY    . HERNIA REPAIR      Family History        Family Status  Relation Name Status  . Mother  Alive  .  Father  Deceased at age 51  . Sister  Alive        Her family history includes Colon cancer in her father; Diabetes in her father; Heart disease in her father and mother.      Allergies  Allergen Reactions  . Nitrofurantoin Rash     Current Outpatient Medications:  .  ALPRAZolam (XANAX) 0.25 MG tablet, Take 1 tablet (0.25 mg total) by mouth every 8 (eight) hours as needed for anxiety., Disp: 90 tablet, Rfl: 5 .  Aspirin-Caffeine (BC FAST PAIN RELIEF PO), Take by mouth as needed., Disp: , Rfl:  .  ibuprofen (ADVIL,MOTRIN) 600 MG tablet, Take 600 mg by mouth every 6 (six) hours as needed., Disp: , Rfl:    Patient Care Team: Jerrol Banana., MD as PCP - General (Family Medicine)    Objective:    Vitals: BP 128/83   Pulse 87   Temp 97.8 F (36.6 C) (Temporal)   Resp 16   Ht _0  (1.727 m)   Wt 164 lb 6.4 oz (74.6 kg)   SpO2 98%   BMI 25.00 kg/m    Vitals:   03/15/19 1040  BP: 128/83  Pulse: 87  Resp: 16  Temp: 97.8 F (36.6 C)  TempSrc: Temporal  SpO2: 98%  Weight: 164 lb 6.4 oz (74.6 kg)  Height: _1  (1.727 m)     Physical Exam Vitals reviewed.  Constitutional:      Appearance: She is well-developed.  HENT:     Head: Normocephalic and atraumatic.     Right Ear: External ear normal.     Left Ear: External ear normal.     Nose: Nose normal.  Eyes:     General: No scleral icterus.    Conjunctiva/sclera: Conjunctivae normal.  Neck:     Thyroid: No thyromegaly.  Cardiovascular:     Rate and Rhythm: Normal rate and regular rhythm.     Pulses: Normal pulses.     Heart sounds: Normal heart sounds.  Pulmonary:     Effort: Pulmonary effort is normal.     Breath sounds: Normal breath sounds.  Abdominal:     Palpations: Abdomen is soft.  Musculoskeletal:     Right lower leg: No edema.     Left lower leg: No edema.  Skin:    General: Skin is warm and dry.  Neurological:     General: No focal deficit present.     Mental Status: She is alert and  oriented to person, place, and time.  Psychiatric:        Mood and Affect: Mood normal.        Behavior: Behavior normal.        Thought Content: Thought content normal.  Judgment: Judgment normal.      Depression Screen PHQ 2/9 Scores 03/15/2019 09/01/2018 09/01/2017 08/13/2016  PHQ - 2 Score 1 0 0 0  PHQ- 9 Score 1 - - 0       Assessment & Plan:     Routine Health Maintenance and Physical Exam  Exercise Activities and Dietary recommendations Goals   None     Immunization History  Administered Date(s) Administered  . Influenza Inj Mdck Quad With Preservative 12/23/2017  . Influenza, Seasonal, Injecte, Preservative Fre 02/08/2013  . Influenza,inj,Quad PF,6+ Mos 03/20/2014  . Tdap 12/11/2009    Health Maintenance  Topic Date Due  . HIV Screening  01/12/1978  . PAP SMEAR-Modifier  01/13/1984  . INFLUENZA VACCINE  10/16/2018  . TETANUS/TDAP  12/12/2019  . COLONOSCOPY  01/31/2020  . MAMMOGRAM  03/18/2020  . Hepatitis C Screening  Completed     Discussed health benefits of physical activity, and encouraged her to engage in regular exercise appropriate for her age and condition.    -------------------------------------------------------------------- 1. Encounter for annual physical exam Well woman exam per [facility]. - CBC with Diff - Comp Met (CMET) - TSH - Lipid panel  2. Hyperlipidemia, unspecified hyperlipidemia type  - Lipid panel  3. Thyroid disorder screen  - TSH  4. Right-sided low back pain without sciatica, unspecified chronicity Chronic back pain.  Recommend regular exercise and stretching.  Consider physical therapy.   Richard Cranford Mon, MD  [facility]

## 2019-03-15 ENCOUNTER — Ambulatory Visit (INDEPENDENT_AMBULATORY_CARE_PROVIDER_SITE_OTHER): Payer: BC Managed Care – PPO | Admitting: Family Medicine

## 2019-03-15 ENCOUNTER — Encounter: Payer: Self-pay | Admitting: Family Medicine

## 2019-03-15 ENCOUNTER — Other Ambulatory Visit: Payer: Self-pay

## 2019-03-15 VITALS — BP 128/83 | HR 87 | Temp 97.8°F | Resp 16 | Ht 68.0 in | Wt 164.4 lb

## 2019-03-15 DIAGNOSIS — M545 Low back pain, unspecified: Secondary | ICD-10-CM

## 2019-03-15 DIAGNOSIS — Z0000 Encounter for general adult medical examination without abnormal findings: Secondary | ICD-10-CM | POA: Diagnosis not present

## 2019-03-15 DIAGNOSIS — E785 Hyperlipidemia, unspecified: Secondary | ICD-10-CM

## 2019-03-15 DIAGNOSIS — Z1329 Encounter for screening for other suspected endocrine disorder: Secondary | ICD-10-CM | POA: Diagnosis not present

## 2019-03-22 ENCOUNTER — Ambulatory Visit (INDEPENDENT_AMBULATORY_CARE_PROVIDER_SITE_OTHER): Payer: BC Managed Care – PPO | Admitting: Physician Assistant

## 2019-03-22 ENCOUNTER — Other Ambulatory Visit: Payer: Self-pay

## 2019-03-22 ENCOUNTER — Encounter: Payer: Self-pay | Admitting: Physician Assistant

## 2019-03-22 VITALS — BP 139/79 | HR 80 | Temp 96.9°F | Wt 162.6 lb

## 2019-03-22 DIAGNOSIS — Z1329 Encounter for screening for other suspected endocrine disorder: Secondary | ICD-10-CM | POA: Diagnosis not present

## 2019-03-22 DIAGNOSIS — Z124 Encounter for screening for malignant neoplasm of cervix: Secondary | ICD-10-CM

## 2019-03-22 DIAGNOSIS — Z90710 Acquired absence of both cervix and uterus: Secondary | ICD-10-CM

## 2019-03-22 DIAGNOSIS — Z0000 Encounter for general adult medical examination without abnormal findings: Secondary | ICD-10-CM | POA: Diagnosis not present

## 2019-03-22 DIAGNOSIS — E785 Hyperlipidemia, unspecified: Secondary | ICD-10-CM | POA: Diagnosis not present

## 2019-03-22 NOTE — Patient Instructions (Signed)
Hysterectomy Information  A hysterectomy is a surgery to remove your uterus. After surgery, you will no longer have periods. Also, you will no longer be able to get pregnant. Reasons for this surgery You may have this surgery if:  You have bleeding in your vagina: ? That is not normal. ? That does not stop, or that keeps coming back.  You have long-term (chronic) pain in your lower belly (pelvic area).  The lining of your uterus grows outside of the uterus (endometriosis).  The lining of your uterus grows in the muscle of the uterus (adenomyosis).  Your uterus falls down into your vagina (prolapse).  You have a growth in your uterus that causes problems (uterine fibroids).  You have cells that could turn into cancer (precancerous cells).  You have cancer of the uterus or cervix. Types of hysterectomies There are 3 types of hysterectomies. Depending on the type, the surgery will:  Remove the top part of the uterus (supracervical).  Remove the uterus and the cervix (total).  Remove the uterus, cervix, and tissue that holds the uterus in place (radical). Ways a hysterectomy can be done This surgery may be done in one of these ways:  A cut (incision) is made in the belly (abdomen). The uterus is taken out through the cut.  A cut is made in the vagina. The uterus is taken out through the cut.  Three or four cuts are made in the belly. A device with a camera is put through one of the cuts. The uterus is cut into pieces and taken out through the cuts or the vagina.  Three or four cuts are made in the belly. A device with a camera is put through one of the cuts. The uterus is taken out through the vagina.  Three or four cuts are made in the belly. A computer helps control the surgical tools. The uterus is cut into small pieces. The pieces are taken out through the cuts or through the vagina. Talk with your doctor about which way is best for you. Risks of hysterectomy Generally,  this surgery is safe. However, problems can happen, including:  Bleeding.  Needing donated blood (transfusion).  Blood clots.  Infection.  Damage to other structures or organs.  Allergic reactions.  Needing to switch to a different type of surgery. What to expect after surgery  You will be given pain medicine.  You will need to stay in the hospital for 1-2 days.  Follow your doctor's instructions about: ? Exercising. ? Driving. ? What activities are safe for you.  You will need to have someone with you at home for 3-5 days.  You will need to see your doctor after 2-4 weeks.  You may get hot flashes, have night sweats, and have trouble sleeping.  You may need to have Pap tests if your surgery was related to cancer. Talk with your doctor about how often you need Pap tests. Questions to ask your doctor  Do I need this surgery? Do I have other treatment options?  What are my options for this surgery?  What needs to be removed?  What are the risks?  What are the benefits?  How long will I need to stay in the hospital?  How long will I need to recover?  What symptoms can I expect after the procedure? Summary  A hysterectomy is a surgery to remove your uterus. After surgery, you will no longer have periods. Also, you will no longer be able to   get pregnant.  Talk with your doctor about which type of hysterectomy is best for you. This information is not intended to replace advice given to you by your health care provider. Make sure you discuss any questions you have with your health care provider. Document Revised: 05/06/2018 Document Reviewed: 06/03/2016 Elsevier Patient Education  2020 Elsevier Inc.  

## 2019-03-22 NOTE — Progress Notes (Signed)
       Patient: Marcia Farmer Female    DOB: Jun 19, 1960   59 y.o.   MRN: [id number] Visit Date: 03/22/2019  Today's Provider: Trinna Post, PA-C   Chief Complaint  Patient presents with  . Gynecologic Exam   Subjective:     HPI  Gynecologic Exam Patient presents today for pap smear and had CPE on 03/15/2019 with her PCP. Patient see Dr. Rosanna Randy but prefers for a female physician to do her pap. She had hysterectomy previously for fibroids and dysfunctional uterine bleeding. She denies there was cancer. She is uncertain if she still has a cervix. Or not.    Allergies  Allergen Reactions  . Nitrofurantoin Rash     Current Outpatient Medications:  .  ALPRAZolam (XANAX) 0.25 MG tablet, Take 1 tablet (0.25 mg total) by mouth every 8 (eight) hours as needed for anxiety., Disp: 90 tablet, Rfl: 5 .  Aspirin-Caffeine (BC FAST PAIN RELIEF PO), Take by mouth as needed., Disp: , Rfl:  .  ibuprofen (ADVIL,MOTRIN) 600 MG tablet, Take 600 mg by mouth every 6 (six) hours as needed., Disp: , Rfl:   Review of Systems  Constitutional: Negative.   Respiratory: Negative.   Cardiovascular: Negative.   Neurological: Negative.     Social History   Tobacco Use  . Smoking status: Never Smoker  . Smokeless tobacco: Never Used  Substance Use Topics  . Alcohol use: Yes    Alcohol/week: 10.0 - 14.0 standard drinks    Types: 10 - 14 Glasses of wine per week    Comment: 7-9oz of wine daily      Objective:   BP 139/79 (BP Location: Left Arm, Patient Position: Sitting, Cuff Size: Normal)   Pulse 80   Temp (!) 96.9 F (36.1 C) (Temporal)   Wt 162 lb 9.6 oz (73.8 kg)   BMI 24.72 kg/m  Vitals:   03/22/19 1330  BP: 139/79  Pulse: 80  Temp: (!) 96.9 F (36.1 C)  TempSrc: Temporal  Weight: 162 lb 9.6 oz (73.8 kg)  Body mass index is 24.72 kg/m.   Physical Exam Exam conducted with a chaperone present.  Genitourinary:    Exam position: Lithotomy position.     Comments: Cervix  is surgically absent.  Skin:    General: Skin is warm and dry.  Neurological:     Mental Status: She is oriented to person, place, and time. Mental status is at baseline.  Psychiatric:        Mood and Affect: Mood normal.        Behavior: Behavior normal.      No results found for any visits on 03/22/19.     Assessment & Plan    1. Cervical cancer screening  She does not have a cervix. She had a hysterectomy many years ago for fibroids and dysfunctional bleeding. Counseled patient that she does not need a screening PAP in the future.   The entirety of the information documented in the History of Present Illness, Review of Systems and Physical Exam were personally obtained by me. Portions of this information were initially documented by [facility] and reviewed by me for thoroughness and accuracy.   F/u PRN      Trinna Post, PA-C  [facility]

## 2019-03-23 LAB — CBC WITH DIFFERENTIAL/PLATELET
Basophils Absolute: 0.1 10*3/uL (ref 0.0–0.2)
Basos: 1 %
EOS (ABSOLUTE): 0.2 10*3/uL (ref 0.0–0.4)
Eos: 2 %
Hematocrit: 40 % (ref 34.0–46.6)
Hemoglobin: 13.8 g/dL (ref 11.1–15.9)
Immature Grans (Abs): 0 10*3/uL (ref 0.0–0.1)
Immature Granulocytes: 0 %
Lymphocytes Absolute: 2.5 10*3/uL (ref 0.7–3.1)
Lymphs: 36 %
MCH: 30.5 pg (ref 26.6–33.0)
MCHC: 34.5 g/dL (ref 31.5–35.7)
MCV: 89 fL (ref 79–97)
Monocytes Absolute: 0.5 10*3/uL (ref 0.1–0.9)
Monocytes: 7 %
Neutrophils Absolute: 3.8 10*3/uL (ref 1.4–7.0)
Neutrophils: 54 %
Platelets: 256 10*3/uL (ref 150–450)
RBC: 4.52 x10E6/uL (ref 3.77–5.28)
RDW: 12.2 % (ref 11.7–15.4)
WBC: 6.9 10*3/uL (ref 3.4–10.8)

## 2019-03-23 LAB — COMPREHENSIVE METABOLIC PANEL
ALT: 31 IU/L (ref 0–32)
AST: 21 IU/L (ref 0–40)
Albumin/Globulin Ratio: 1.9 (ref 1.2–2.2)
Albumin: 4.8 g/dL (ref 3.8–4.9)
Alkaline Phosphatase: 112 IU/L (ref 39–117)
BUN/Creatinine Ratio: 14 (ref 9–23)
BUN: 11 mg/dL (ref 6–24)
Bilirubin Total: 0.4 mg/dL (ref 0.0–1.2)
CO2: 26 mmol/L (ref 20–29)
Calcium: 9.8 mg/dL (ref 8.7–10.2)
Chloride: 101 mmol/L (ref 96–106)
Creatinine, Ser: 0.78 mg/dL (ref 0.57–1.00)
GFR calc Af Amer: 98 mL/min/{1.73_m2} (ref >59)
GFR calc non Af Amer: 85 mL/min/{1.73_m2} (ref >59)
Globulin, Total: 2.5 g/dL (ref 1.5–4.5)
Glucose: 85 mg/dL (ref 65–99)
Potassium: 4.2 mmol/L (ref 3.5–5.2)
Sodium: 140 mmol/L (ref 134–144)
Total Protein: 7.3 g/dL (ref 6.0–8.5)

## 2019-03-23 LAB — LIPID PANEL
Chol/HDL Ratio: 4 ratio (ref 0.0–4.4)
Cholesterol, Total: 287 mg/dL — ABNORMAL HIGH (ref 100–199)
HDL: 72 mg/dL (ref >39)
LDL Chol Calc (NIH): 199 mg/dL — ABNORMAL HIGH (ref 0–99)
Triglycerides: 93 mg/dL (ref 0–149)
VLDL Cholesterol Cal: 16 mg/dL (ref 5–40)

## 2019-03-23 LAB — TSH: TSH: 1.79 u[IU]/mL (ref 0.450–4.500)

## 2019-04-23 IMAGING — CR DG THORACIC SPINE 2V
1 series · 3 of 3 positions shown · non-contrast
Comparison: 12/11/2015

CLINICAL DATA: Back pain, no known injury, initial encounter

EXAM:
THORACIC SPINE 2 VIEWS

[Series 1: dg thoracic spine 2 view · 0.14mm/px · 3 of 3 slices shown]
[im 1/3]
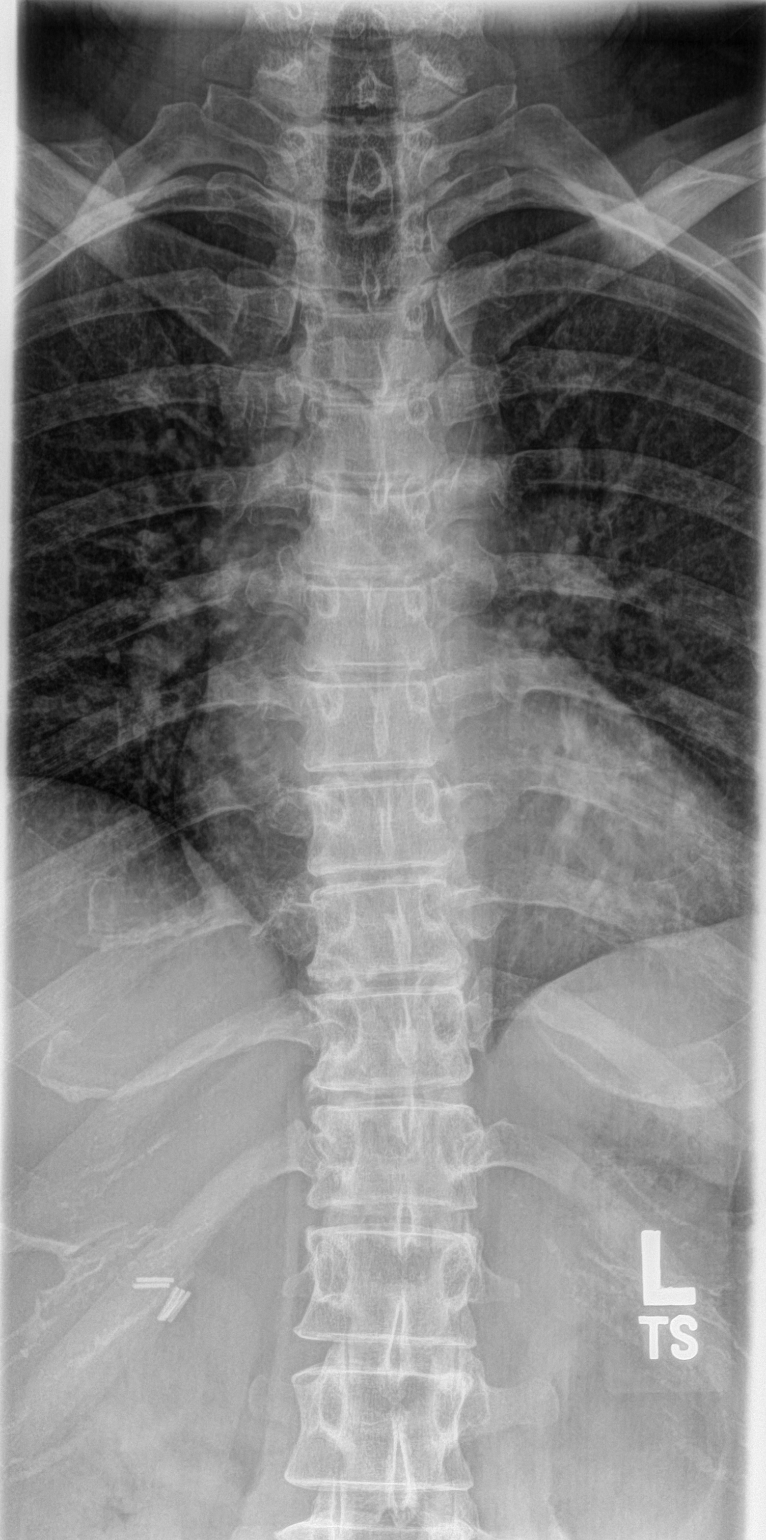
[im 2/3]
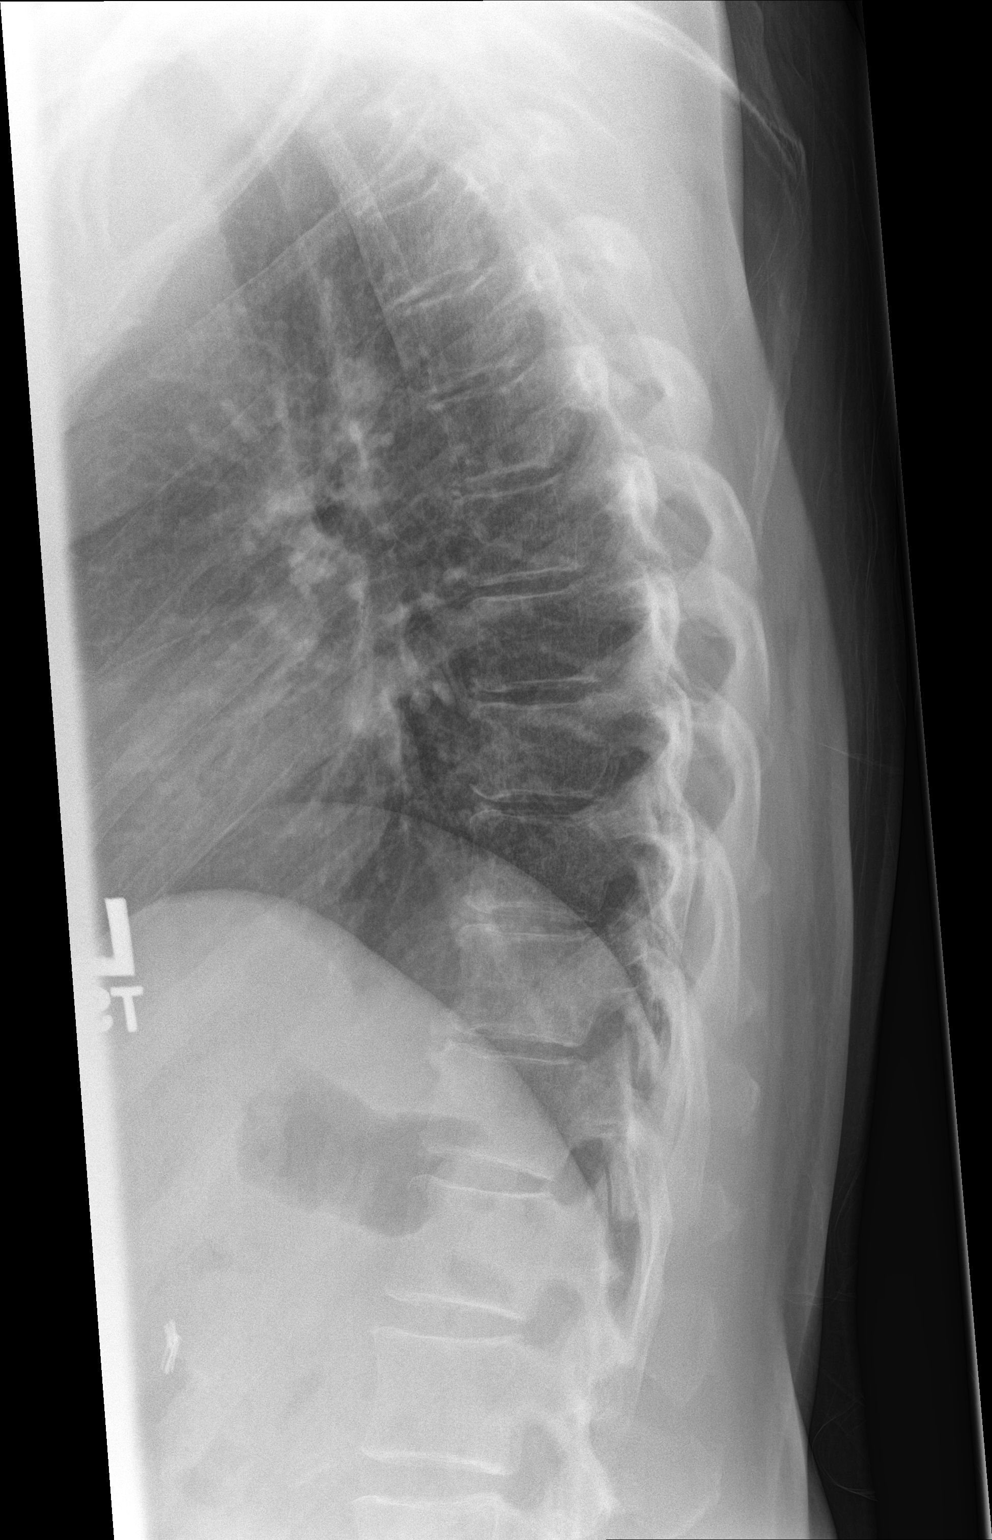
[im 3/3]
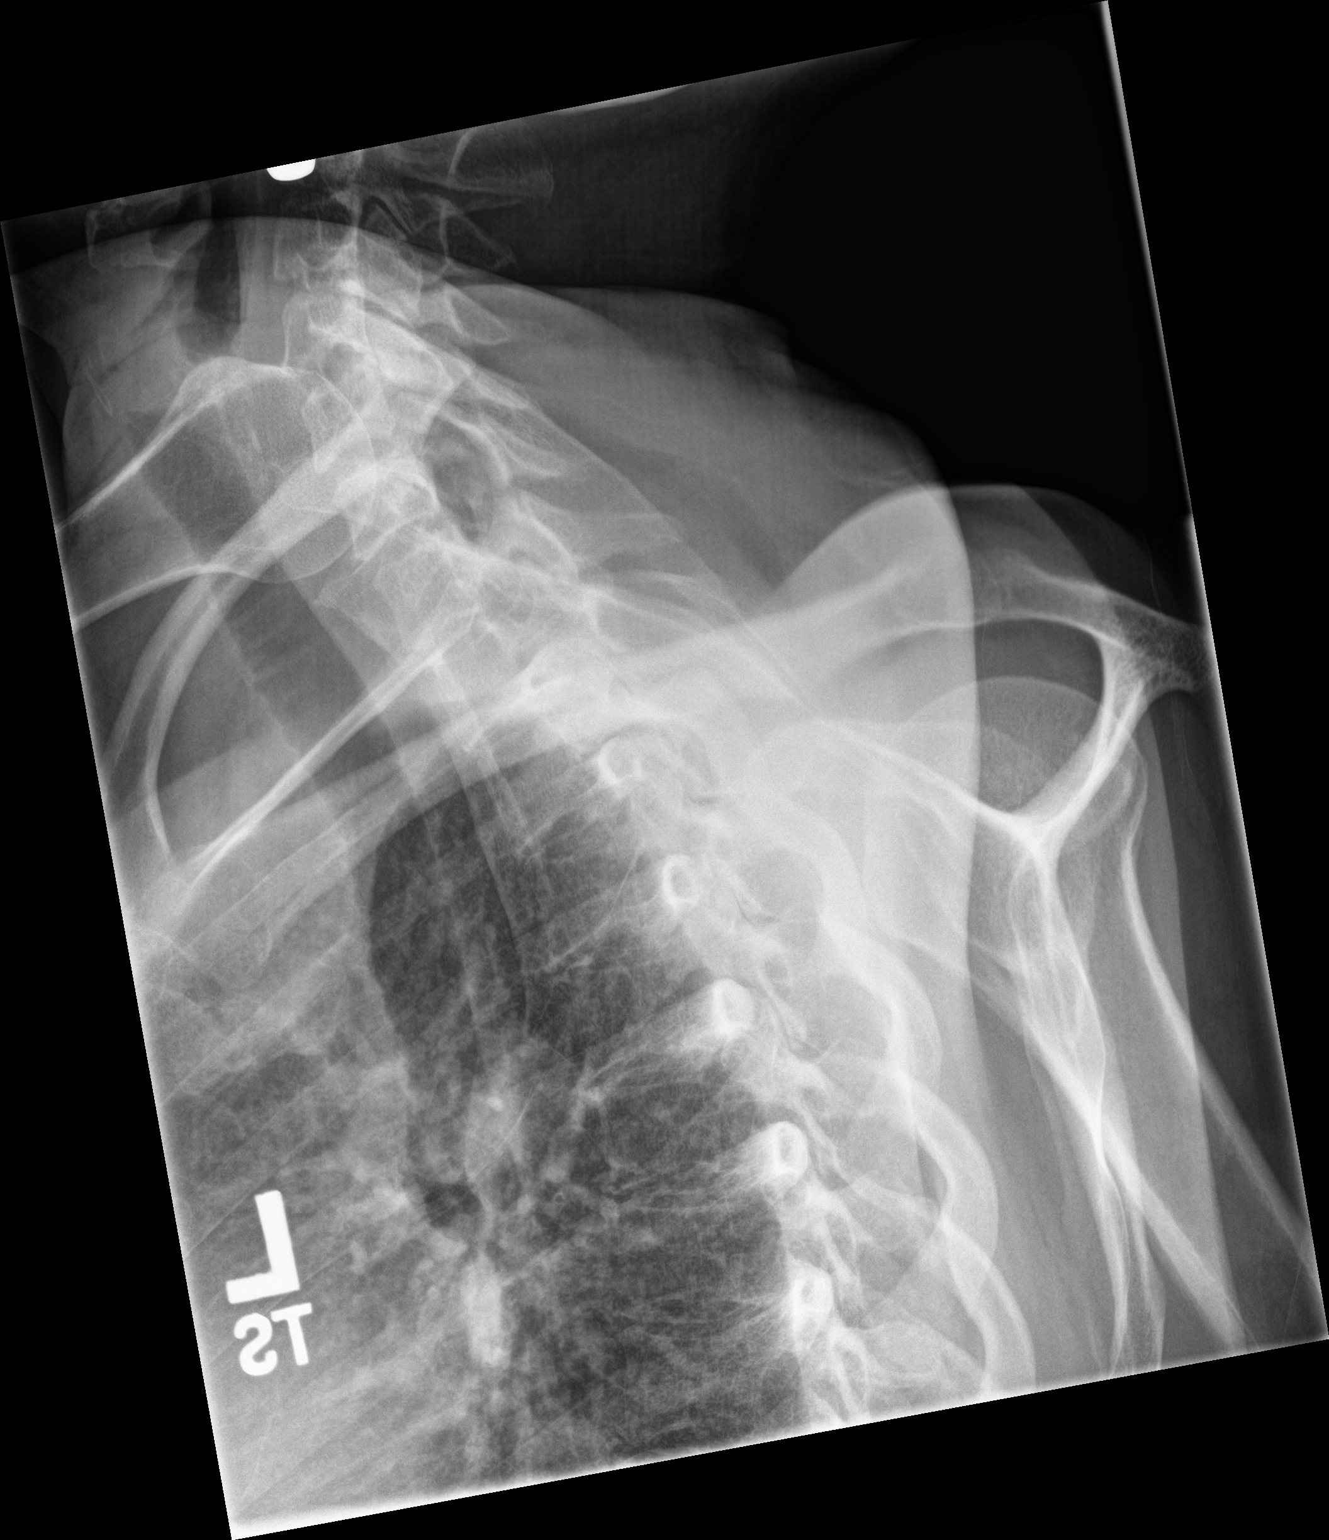

[3 of 3 positions shown; findings below may reference images not displayed]

FINDINGS: Vertebral body height is well maintained. No anterolisthesis is
seen. Pedicles are within normal limits.
IMPRESSION: Mild degenerative change without acute abnormality.

## 2019-04-23 IMAGING — CR DG LUMBAR SPINE 2-3V
1 series · 3 of 3 positions shown · non-contrast
Comparison: 07/12/2015

CLINICAL DATA: Low back pain radiating into the right hip

EXAM:
LUMBAR SPINE - 3 VIEW

[Series 1: dg lumbar spine 2-3 views · 0.14mm/px · 3 of 3 slices shown]
[im 1/3]
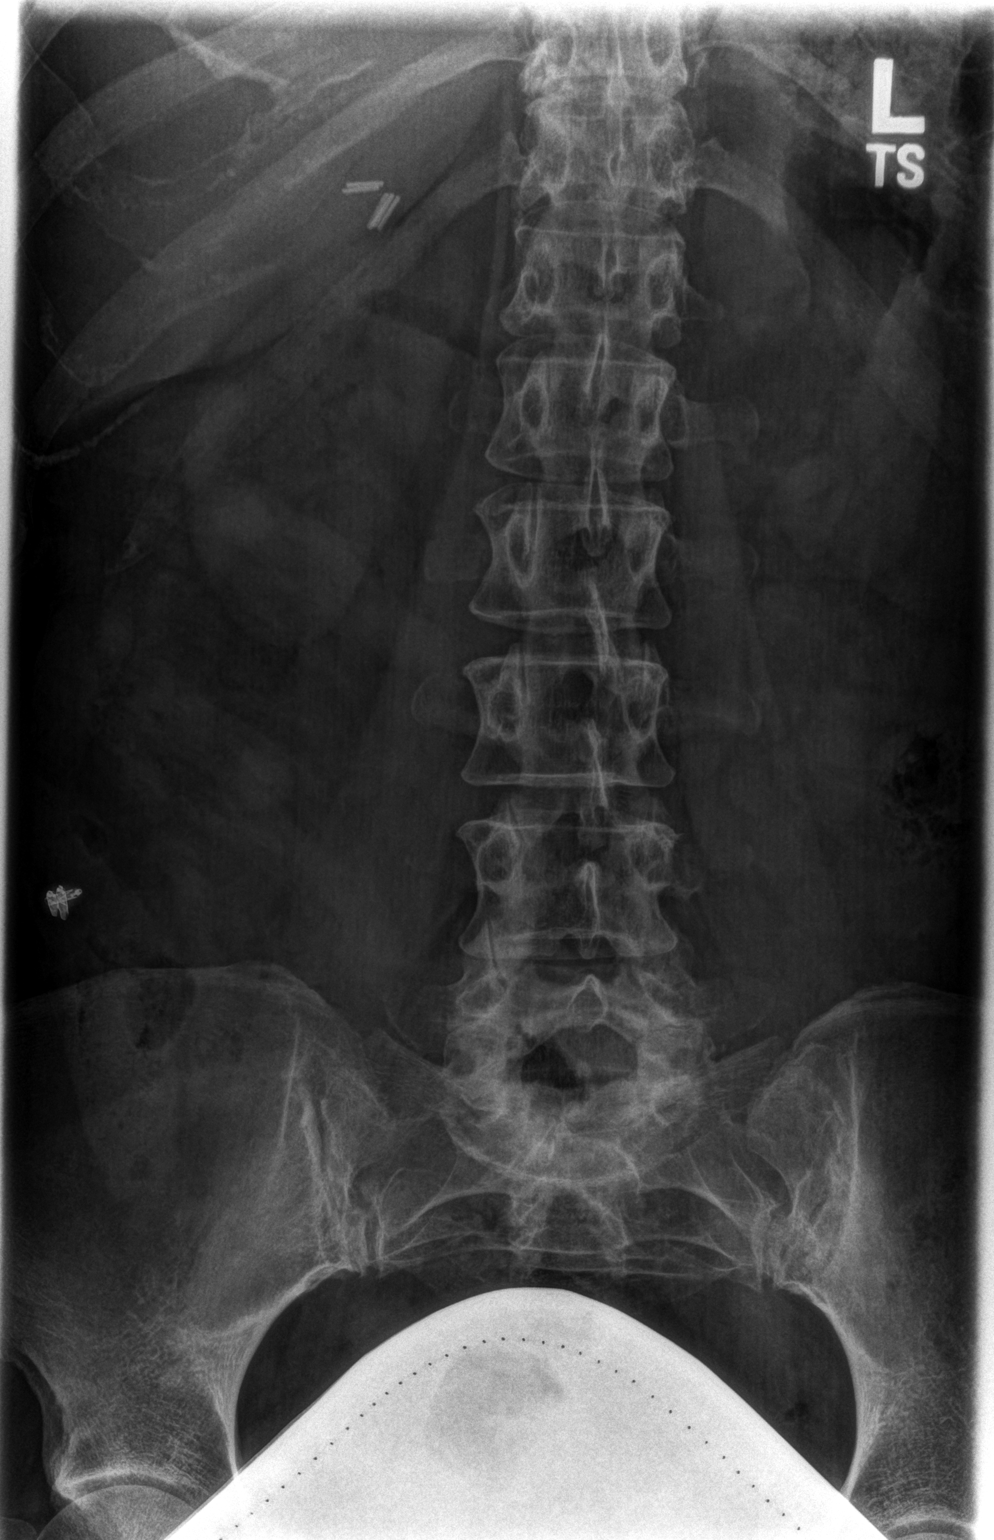
[im 2/3]
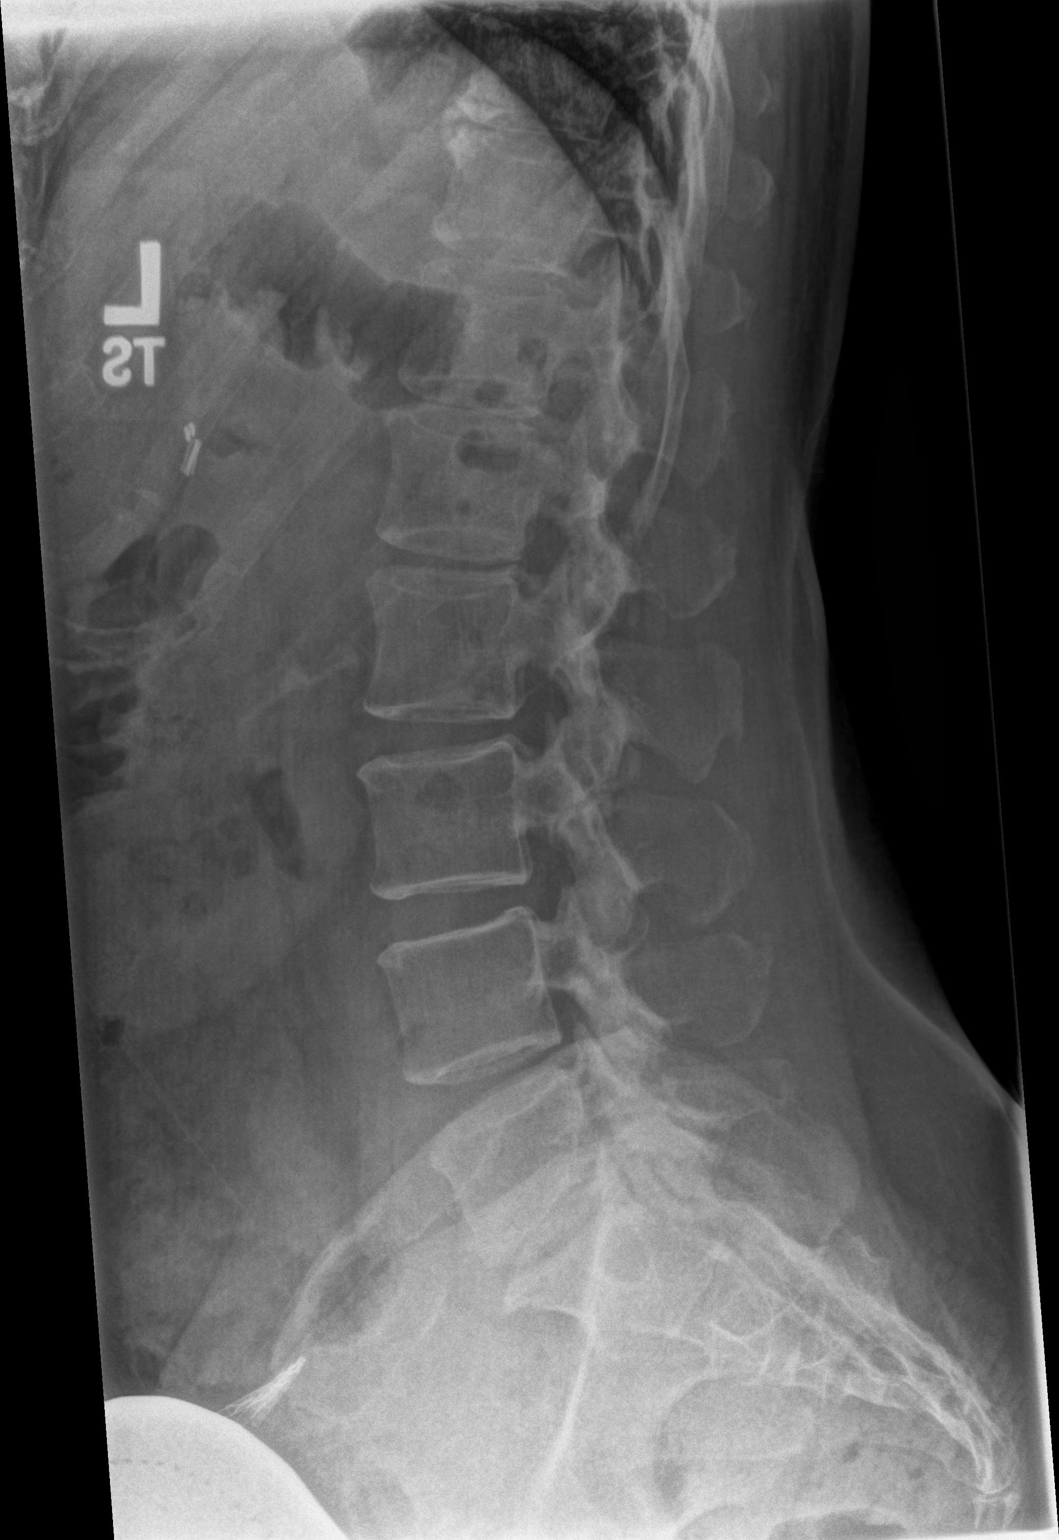
[im 3/3]
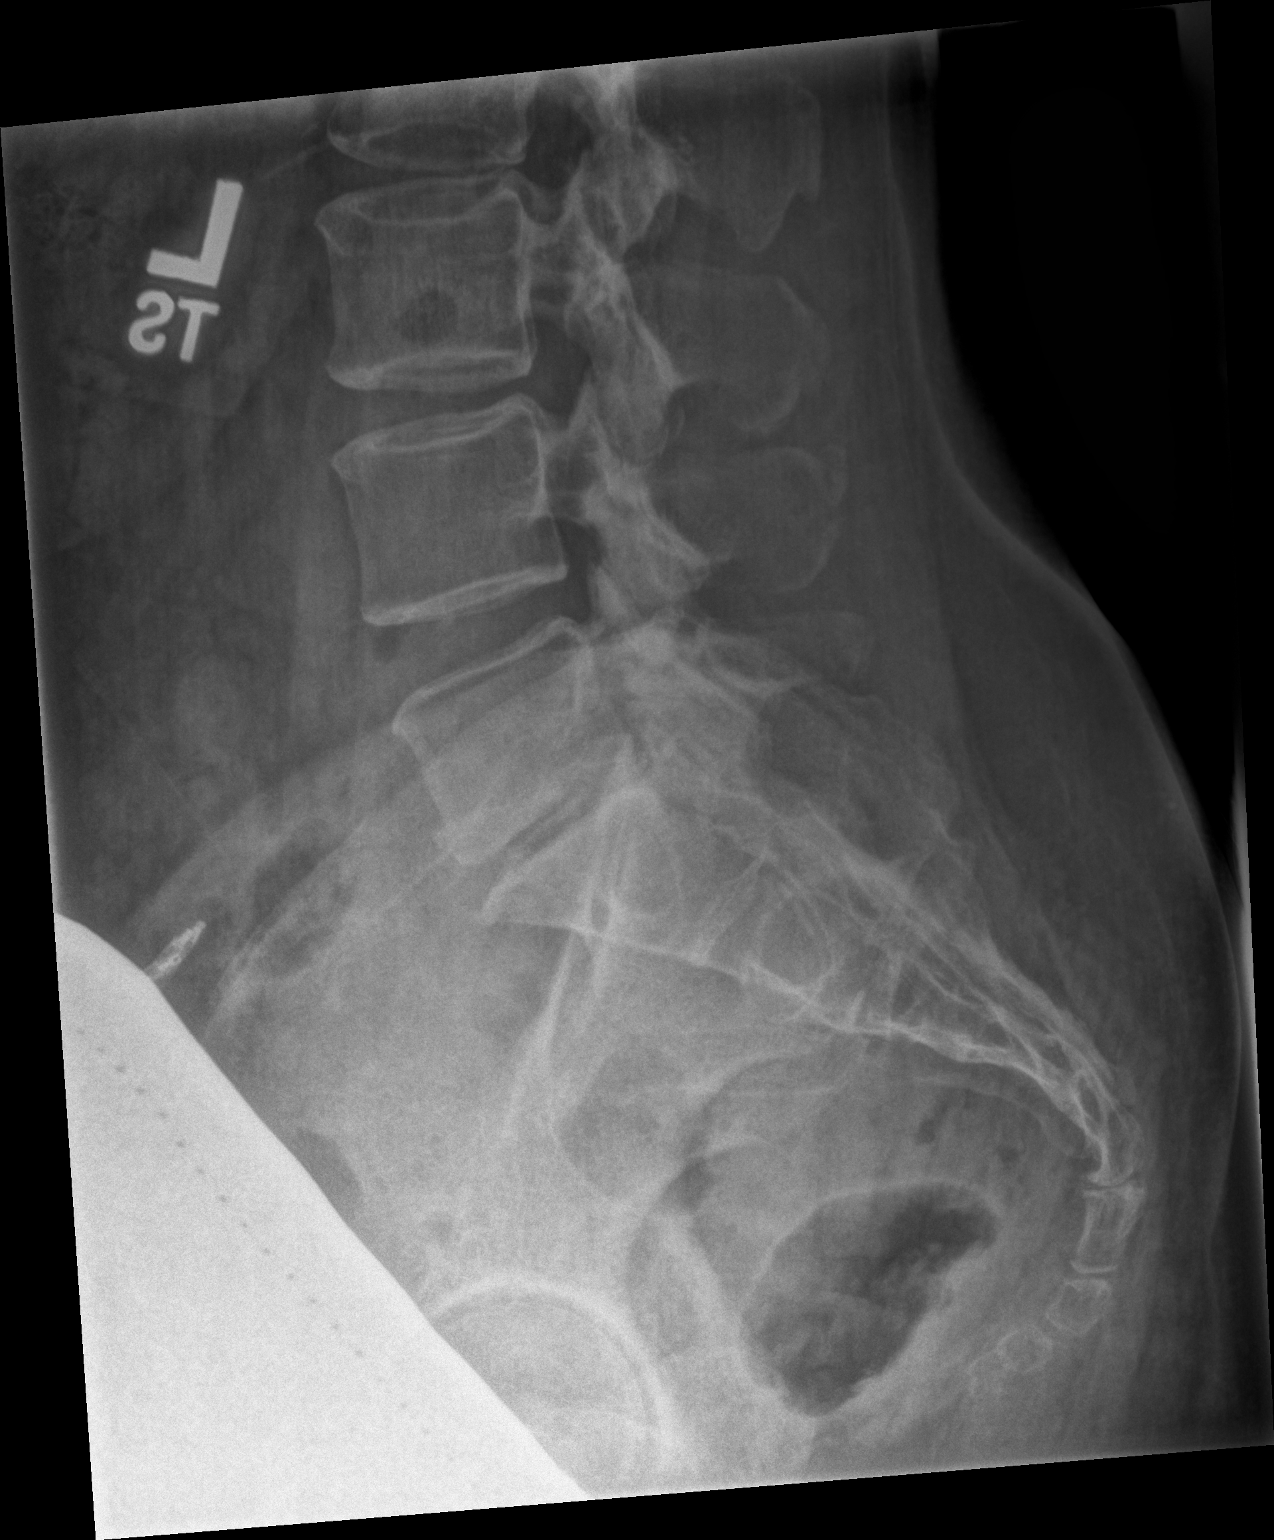

[3 of 3 positions shown; findings below may reference images not displayed]

FINDINGS: Vertebral body height is well maintained. Disc space narrowing is
again seen at L5-S1. No compression deformities are seen. No soft
tissue abnormality is noted.
IMPRESSION: Stable degenerative change.

## 2019-07-17 DIAGNOSIS — L249 Irritant contact dermatitis, unspecified cause: Secondary | ICD-10-CM | POA: Diagnosis not present

## 2019-07-24 DIAGNOSIS — Z20822 Contact with and (suspected) exposure to covid-19: Secondary | ICD-10-CM | POA: Diagnosis not present

## 2019-07-24 DIAGNOSIS — Z01812 Encounter for preprocedural laboratory examination: Secondary | ICD-10-CM | POA: Diagnosis not present

## 2019-07-26 DIAGNOSIS — D123 Benign neoplasm of transverse colon: Secondary | ICD-10-CM | POA: Diagnosis not present

## 2019-07-26 DIAGNOSIS — Z86010 Personal history of colonic polyps: Secondary | ICD-10-CM | POA: Diagnosis not present

## 2019-07-26 DIAGNOSIS — Z9889 Other specified postprocedural states: Secondary | ICD-10-CM | POA: Diagnosis not present

## 2019-07-26 DIAGNOSIS — K5730 Diverticulosis of large intestine without perforation or abscess without bleeding: Secondary | ICD-10-CM | POA: Diagnosis not present

## 2019-07-26 DIAGNOSIS — Z1211 Encounter for screening for malignant neoplasm of colon: Secondary | ICD-10-CM | POA: Diagnosis not present

## 2019-07-26 DIAGNOSIS — Z800 Family history of malignant neoplasm of digestive organs: Secondary | ICD-10-CM | POA: Diagnosis not present

## 2019-07-26 DIAGNOSIS — K635 Polyp of colon: Secondary | ICD-10-CM | POA: Diagnosis not present

## 2019-07-26 DIAGNOSIS — D120 Benign neoplasm of cecum: Secondary | ICD-10-CM | POA: Diagnosis not present

## 2019-09-20 NOTE — Progress Notes (Signed)
Established patient visit   Patient: Marcia Farmer   DOB: [birth year]   59 y.o. Female  MRN: [id number] Visit Date: 09/21/2019  I,Tenaya Hilyer S Honestee Revard,acting as a scribe for [name], MD.,have documented all relevant documentation on the behalf of [name], MD,as directed by  [name], MD while in the presence of [name], MD.  Today's healthcare provider: [name], MD   Chief Complaint  Patient presents with   Hyperlipidemia   Knee Pain   Subjective    HPI  Lipid/Cholesterol, Follow-up  Last lipid panel Other pertinent labs  Lab Results  Component Value Date   CHOL 287 (H) 03/22/2019   HDL 72 03/22/2019   LDLCALC 199 (H) 03/22/2019   TRIG 93 03/22/2019   CHOLHDL 4.0 03/22/2019   Lab Results  Component Value Date   ALT 31 03/22/2019   AST 21 03/22/2019   PLT 256 03/22/2019   TSH 1.790 03/22/2019     She was last seen for this 6 months ago.  Management since that visit includes working on diet and exercise.  She reports fair compliance with treatment. Patient reports she was not able to start healthy lifestyle changes due to taking care of her ill mother.  She is not having side effects.    Current diet: in general, an "unhealthy" diet Current exercise: gardening, housecleaning, walking and yard work  The 10-year ASCVD risk score Mikey Bussing DC Jr., et al., 2013) is: 2%  --------------------------------------------------------------------------------------------------- Patient C/O left knee pain x one week. Patient denies any falls or injuries. Patient reports pain is on and off, worse with activity. Patient denies any swelling. Patient reports taking Naproxen as needed for pain, reports mild pain control.  Some pain with weightbearing on the left knee and it feels as though her kneecap might slip out.  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   Family history of colon cancer 07/10/2015   Bradycardia 03/02/2014   Cardiac murmur 02/28/2014   Combined fat and carbohydrate induced hyperlipemia 02/28/2014   Paroxysmal supraventricular tachycardia ([place]) 02/28/2014   Cholelithiasis and cholecystitis without obstruction 10/05/2006   Social History   Tobacco Use   Smoking status: Never Smoker   Smokeless tobacco: Never Used  Vaping Use   Vaping Use: Never used  Substance Use Topics   Alcohol use: Yes    Alcohol/week: 10.0 - 14.0 standard drinks    Types: 10 - 14 Glasses of wine per week    Comment: 7-9oz of wine daily   Drug use: No       Medications: Outpatient Medications Prior to Visit  Medication Sig   ibuprofen (ADVIL,MOTRIN) 600 MG tablet Take 600 mg by mouth every 6 (six) hours as needed.   ALPRAZolam (XANAX) 0.25 MG tablet Take 1 tablet (0.25 mg total) by mouth every 8 (eight) hours as needed for anxiety. (Patient not taking: Reported on 09/21/2019)   [DISCONTINUED] Aspirin-Caffeine (BC FAST PAIN RELIEF PO) Take by mouth as needed. (Patient not taking: Reported on 09/21/2019)   No facility-administered medications prior to visit.    Review of Systems  Constitutional: Negative.   Cardiovascular: Negative.   Musculoskeletal: Positive for arthralgias and myalgias (left knee).  Allergic/Immunologic: Negative.   Neurological: Negative.   Hematological: Negative.   Psychiatric/Behavioral: Negative.       Objective    BP 114/75 (BP  Location: Right Arm, Patient Position: Sitting, Cuff Size: Normal)    Pulse 80    Temp (!) 97.3 F (36.3 C) (Temporal)    Resp 16    Ht 5\' 8"  (1.727 m)    Wt 169 lb 6.4 oz (76.8 kg)    BMI 25.76 kg/m  BP Readings from Last 3 Encounters:  09/21/19 114/75  03/22/19 139/79  03/15/19 128/83   Wt Readings from Last 3 Encounters:  09/21/19 169 lb 6.4 oz (76.8 kg)  03/22/19 162 lb 9.6 oz (73.8 kg)  03/15/19 164 lb 6.4 oz (74.6 kg)       Physical Exam Constitutional:      Appearance: Normal appearance.  HENT:     Right Ear: External ear normal.     Left Ear: External ear normal.  Eyes:     General: No scleral icterus.    Conjunctiva/sclera: Conjunctivae normal.  Cardiovascular:     Rate and Rhythm: Normal rate and regular rhythm.     Pulses: Normal pulses.     Heart sounds: Normal heart sounds.  Pulmonary:     Effort: Pulmonary effort is normal.     Breath sounds: Normal breath sounds.  Musculoskeletal:     Left knee: Tenderness present over the medial joint line.     Comments: Mild medial joint line tenderness of the left knee.  No effusion noticed.  Knee is stable.  Skin:    General: Skin is warm and dry.  Neurological:     Mental Status: She is alert and oriented to person, place, and time. Mental status is at baseline.  Psychiatric:        Mood and Affect: Mood normal.        Behavior: Behavior normal.        Thought Content: Thought content normal.        Judgment: Judgment normal.     No results found for any visits on 09/21/19.   Assessment & Plan     1. Hyperlipidemia, unspecified hyperlipidemia type Counseled on importance of healthy lifestyle changes.  LDL was 199. Not on statin Repeat FLP and CMP Goal LDL <130 - Lipid Panel With LDL/HDL Ratio  2. Acute pain of left knee New problem.  Could be arthritis, could be meniscus could be bursitis versus patellar issue.  He wishes to proceed with orthopedic referral. Requested referral to orthopedics Continue OTC medication for pain - AMB referral to orthopedics 3.  Sessile colonic polyp Has GI follow-up  Return in about 6 months (around 03/23/2020) for chronic disease f/u.      I, [name], MD, have reviewed all documentation for this visit. The documentation on 09/21/19 for the exam, diagnosis, procedures, and orders are all accurate and complete.    Richard Cranford Mon, MD  [facility] [phone]  (phone) [phone] (fax)  [place]

## 2019-09-21 ENCOUNTER — Encounter: Payer: Self-pay | Admitting: Family Medicine

## 2019-09-21 ENCOUNTER — Other Ambulatory Visit: Payer: Self-pay

## 2019-09-21 ENCOUNTER — Ambulatory Visit: Payer: BC Managed Care – PPO | Admitting: Family Medicine

## 2019-09-21 VITALS — BP 114/75 | HR 80 | Temp 97.3°F | Resp 16 | Ht 68.0 in | Wt 169.4 lb

## 2019-09-21 DIAGNOSIS — E785 Hyperlipidemia, unspecified: Secondary | ICD-10-CM | POA: Diagnosis not present

## 2019-09-21 DIAGNOSIS — K635 Polyp of colon: Secondary | ICD-10-CM | POA: Diagnosis not present

## 2019-09-21 DIAGNOSIS — M25562 Pain in left knee: Secondary | ICD-10-CM

## 2019-09-21 NOTE — Assessment & Plan Note (Addendum)
Has not been able to start healthy lifestyle changes Discussed importance of healthy weight management Discussed diet and exercise Not on statin Repeat FLP and CMP Goal LDL < 130 Follow up in 6 months

## 2019-09-21 NOTE — Patient Instructions (Signed)

## 2019-09-22 DIAGNOSIS — M1712 Unilateral primary osteoarthritis, left knee: Secondary | ICD-10-CM | POA: Diagnosis not present

## 2019-09-22 DIAGNOSIS — E785 Hyperlipidemia, unspecified: Secondary | ICD-10-CM | POA: Diagnosis not present

## 2019-09-23 LAB — LIPID PANEL WITH LDL/HDL RATIO
Cholesterol, Total: 274 mg/dL — ABNORMAL HIGH (ref 100–199)
HDL: 49 mg/dL (ref >39)
LDL Chol Calc (NIH): 197 mg/dL — ABNORMAL HIGH (ref 0–99)
LDL/HDL Ratio: 4 ratio — ABNORMAL HIGH (ref 0.0–3.2)
Triglycerides: 150 mg/dL — ABNORMAL HIGH (ref 0–149)
VLDL Cholesterol Cal: 28 mg/dL (ref 5–40)

## 2019-09-27 ENCOUNTER — Other Ambulatory Visit: Payer: Self-pay | Admitting: *Deleted

## 2019-09-27 DIAGNOSIS — E785 Hyperlipidemia, unspecified: Secondary | ICD-10-CM

## 2019-09-27 MED ORDER — ROSUVASTATIN CALCIUM 10 MG PO TABS: 10.0000 mg | ORAL_TABLET | Freq: Every day | ORAL | 1 refills | Status: DC

## 2019-10-04 DIAGNOSIS — Z1231 Encounter for screening mammogram for malignant neoplasm of breast: Secondary | ICD-10-CM | POA: Diagnosis not present

## 2019-10-04 DIAGNOSIS — M1712 Unilateral primary osteoarthritis, left knee: Secondary | ICD-10-CM | POA: Diagnosis not present

## 2019-10-04 LAB — HM MAMMOGRAPHY

## 2019-10-30 DIAGNOSIS — J069 Acute upper respiratory infection, unspecified: Secondary | ICD-10-CM | POA: Diagnosis not present

## 2019-10-30 DIAGNOSIS — U071 COVID-19: Secondary | ICD-10-CM | POA: Diagnosis not present

## 2019-10-30 DIAGNOSIS — J029 Acute pharyngitis, unspecified: Secondary | ICD-10-CM | POA: Diagnosis not present

## 2019-10-30 DIAGNOSIS — Z03818 Encounter for observation for suspected exposure to other biological agents ruled out: Secondary | ICD-10-CM | POA: Diagnosis not present

## 2019-11-03 NOTE — Progress Notes (Deleted)
     Established patient visit   Patient: Marcia Farmer   DOB: [birth year]   59 y.o. Female  MRN: [id number] Visit Date: 11/07/2019  Today's healthcare provider: [name], MD   No chief complaint on file.  Subjective    HPI Lipid/Cholesterol, follow-up  Last Lipid Panel: Lab Results  Component Value Date   CHOL 274 (H) 09/22/2019   LDLCALC 197 (H) 09/22/2019   HDL 49 09/22/2019   TRIG 150 (H) 09/22/2019    She was last seen for this 1 months ago.  Management since that visit includes; labs checked showing-Cholesterol still too high. Started Crestor 10 mg daily. Follow-up 1 to 2 months in the office. She reports {excellent/good/fair/poor:19665} compliance with treatment. She {is/is not:9024} having side effects. {document side effects if present:1} She is following a {diet:21022986} diet. Current exercise: {exercise XBMWU:13244}  Last metabolic panel Lab Results  Component Value Date   GLUCOSE 85 03/22/2019   NA 140 03/22/2019   K 4.2 03/22/2019   BUN 11 03/22/2019   CREATININE 0.78 03/22/2019   GFRNONAA 85 03/22/2019   GFRAA 98 03/22/2019   CALCIUM 9.8 03/22/2019   AST 21 03/22/2019   ALT 31 03/22/2019   The 10-year ASCVD risk score Mikey Bussing DC Jr., et al., 2013) is: 2.8%  ---------------------------------------------------------------------------------------------------   {Show patient history (optional):23778::" "}   Medications: Outpatient Medications Prior to Visit  Medication Sig  . ALPRAZolam (XANAX) 0.25 MG tablet Take 1 tablet (0.25 mg total) by mouth every 8 (eight) hours as needed for anxiety. (Patient not taking: Reported on 09/21/2019)  . ibuprofen (ADVIL,MOTRIN) 600 MG tablet Take 600 mg by mouth every 6 (six) hours as needed.  . rosuvastatin (CRESTOR) 10 MG tablet Take 1 tablet (10 mg total) by mouth daily.   No facility-administered medications prior to visit.    Review of Systems  Constitutional: Negative for appetite change,  chills, fatigue and fever.  Respiratory: Negative for chest tightness and shortness of breath.   Cardiovascular: Negative for chest pain and palpitations.  Gastrointestinal: Negative for abdominal pain, nausea and vomiting.  Neurological: Negative for dizziness and weakness.    {Heme  Chem  Endocrine  Serology  Results Review (optional):23779::" "}  Objective    There were no vitals taken for this visit. {Show previous vital signs (optional):23777::" "}  Physical Exam  ***  No results found for any visits on 11/07/19.  Assessment & Plan     ***  No follow-ups on file.      {provider attestation***:1}   [name], MD  [facility] [phone] (phone) [phone] (fax)  [place]

## 2019-11-07 ENCOUNTER — Ambulatory Visit: Payer: BC Managed Care – PPO | Admitting: Family Medicine

## 2019-11-07 DIAGNOSIS — E785 Hyperlipidemia, unspecified: Secondary | ICD-10-CM

## 2019-11-11 NOTE — Progress Notes (Signed)
I,[name],acting as a scribe for [name], MD.,have documented all relevant documentation on the behalf of [name], MD,as directed by  [name], MD while in the presence of [name], MD.   Established patient visit   Patient: Marcia Farmer   DOB: 06-01-1960   59 y.o. Female  MRN: [id number] Visit Date: 11/16/2019  Today's healthcare provider: [name], MD   Chief Complaint  Patient presents with  . Follow-up  . Hyperlipidemia   Subjective    HPI  She has recovered from Covid. She is tolerating rosuvastatin for her lipids. Lipid/Cholesterol, follow-up  Last Lipid Panel: Lab Results  Component Value Date   CHOL 274 (H) 09/22/2019   LDLCALC 197 (H) 09/22/2019   HDL 49 09/22/2019   TRIG 150 (H) 09/22/2019    She was last seen for this 2 months ago.  Management since that visit includes; Cholesterol still too high. Started Crestor 10 mg daily. Follow-up 1 to 2 months in the office. She reports good compliance with treatment. She is not having side effects. none She is following a Regular diet. Current exercise: none  Last metabolic panel Lab Results  Component Value Date   GLUCOSE 85 03/22/2019   NA 140 03/22/2019   K 4.2 03/22/2019   BUN 11 03/22/2019   CREATININE 0.78 03/22/2019   GFRNONAA 85 03/22/2019   GFRAA 98 03/22/2019   CALCIUM 9.8 03/22/2019   AST 21 03/22/2019   ALT 31 03/22/2019   The 10-year ASCVD risk score Mikey Bussing DC Jr., et al., 2013) is: 2.8%  --------------------------------------------------------------------  Patient states she is doing well on Crestor.       Medications: Outpatient Medications Prior to Visit  Medication Sig  . ibuprofen (ADVIL,MOTRIN) 600 MG tablet Take 600 mg by mouth every 6 (six) hours as needed.  . rosuvastatin (CRESTOR) 10 MG tablet Take 1 tablet (10 mg total) by mouth daily.  Marland Kitchen ALPRAZolam (XANAX) 0.25 MG tablet Take 1 tablet (0.25 mg total) by mouth  every 8 (eight) hours as needed for anxiety. (Patient not taking: Reported on 09/21/2019)   No facility-administered medications prior to visit.    Review of Systems  Constitutional: Negative for appetite change, chills, fatigue and fever.  Respiratory: Negative for chest tightness and shortness of breath.   Cardiovascular: Negative for chest pain and palpitations.  Gastrointestinal: Negative for abdominal pain, nausea and vomiting.  Neurological: Negative for dizziness and weakness.       Objective    BP 117/74 (BP Location: Right Arm, Patient Position: Sitting, Cuff Size: Large)   Pulse 72   Temp 97.9 F (36.6 C) (Oral)   Resp 16   Ht 5\' 8"  (1.727 m)   Wt 164 lb (74.4 kg)   SpO2 98%   BMI 24.94 kg/m     Physical Exam Vitals reviewed.     BP 117/74 (BP Location: Right Arm, Patient Position: Sitting, Cuff Size: Large)   Pulse 72   Temp 97.9 F (36.6 C) (Oral)   Resp 16   Ht 5\' 8"  (1.727 m)   Wt 164 lb (74.4 kg)   SpO2 98%   BMI 24.94 kg/m   General Appearance:    Alert, cooperative, no distress, appears stated age  Head:    Normocephalic, without obvious abnormality, atraumatic  Eyes:    PERRL, conjunctiva/corneas clear, EOM's intact, fundi    benign, both eyes  Ears:    Normal TM's and external ear canals, both  ears  Nose:   Nares normal, septum midline, mucosa normal, no drainage    or sinus tenderness  Throat:   Lips, mucosa, and tongue normal; teeth and gums normal  Neck:   Supple, symmetrical, trachea midline, no adenopathy;    thyroid:  no enlargement/tenderness/nodules; no carotid   bruit or JVD  Back:     Symmetric, no curvature, ROM normal, no CVA tenderness  Lungs:     Clear to auscultation bilaterally, respirations unlabored  Chest Wall:    No tenderness or deformity   Heart:    Regular rate and rhythm, S1 and S2 normal, no murmur, rub   or gallop  Breast Exam:      Abdomen:     Soft, non-tender, bowel sounds active all four quadrants,    no  masses, no organomegaly  Genitalia:    Rectal:    Extremities:   Extremities normal, atraumatic, no cyanosis or edema  Pulses:   2+ and symmetric all extremities  Skin:   Skin color, texture, turgor normal, no rashes or lesions  Lymph nodes:   Cervical, supraclavicular, and axillary nodes normal  Neurologic:        No results found for any visits on 11/16/19.  Assessment & Plan     1. Hyperlipidemia, unspecified hyperlipidemia type On rosuvastatin 10. - Lipid panel - Hepatic function panel 2. recent COVID-19 infection Symptoms resolved. Return in about 6 months (around 05/15/2020).      I, [name], MD, have reviewed all documentation for this visit. The documentation on 11/28/19 for the exam, diagnosis, procedures, and orders are all accurate and complete.    Brenner Visconti Cranford Mon, MD  [facility] [phone] (phone) [phone] (fax)  [place]

## 2019-11-16 ENCOUNTER — Other Ambulatory Visit: Payer: Self-pay

## 2019-11-16 ENCOUNTER — Ambulatory Visit: Payer: BC Managed Care – PPO | Admitting: Family Medicine

## 2019-11-16 ENCOUNTER — Encounter: Payer: Self-pay | Admitting: Family Medicine

## 2019-11-16 VITALS — BP 117/74 | HR 72 | Temp 97.9°F | Resp 16 | Ht 68.0 in | Wt 164.0 lb

## 2019-11-16 DIAGNOSIS — E785 Hyperlipidemia, unspecified: Secondary | ICD-10-CM | POA: Diagnosis not present

## 2019-11-16 DIAGNOSIS — U071 COVID-19: Secondary | ICD-10-CM

## 2019-11-17 LAB — LIPID PANEL
Chol/HDL Ratio: 3.7 ratio (ref 0.0–4.4)
Cholesterol, Total: 193 mg/dL (ref 100–199)
HDL: 52 mg/dL (ref >39)
LDL Chol Calc (NIH): 115 mg/dL — ABNORMAL HIGH (ref 0–99)
Triglycerides: 145 mg/dL (ref 0–149)
VLDL Cholesterol Cal: 26 mg/dL (ref 5–40)

## 2019-11-17 LAB — HEPATIC FUNCTION PANEL
ALT: 30 IU/L (ref 0–32)
AST: 20 IU/L (ref 0–40)
Albumin: 4.6 g/dL (ref 3.8–4.9)
Alkaline Phosphatase: 91 IU/L (ref 48–121)
Bilirubin Total: 0.4 mg/dL (ref 0.0–1.2)
Bilirubin, Direct: 0.11 mg/dL (ref 0.00–0.40)
Total Protein: 6.9 g/dL (ref 6.0–8.5)

## 2019-11-26 ENCOUNTER — Other Ambulatory Visit: Payer: Self-pay | Admitting: Family Medicine

## 2019-11-26 DIAGNOSIS — E785 Hyperlipidemia, unspecified: Secondary | ICD-10-CM

## 2019-11-28 ENCOUNTER — Telehealth: Payer: Self-pay

## 2019-11-28 DIAGNOSIS — E785 Hyperlipidemia, unspecified: Secondary | ICD-10-CM

## 2019-11-28 MED ORDER — ROSUVASTATIN CALCIUM 10 MG PO TABS: 10.0000 mg | ORAL_TABLET | Freq: Every day | ORAL | 3 refills | Status: DC

## 2019-11-28 NOTE — Telephone Encounter (Signed)
Copied from [place] [phone]. Topic: General - Other >> Nov 28, 2019 10:10 AM Leward Quan A wrote: Reason for CRM: Patient called to ask Dr Rosanna Randy to please send Rx for rosuvastatin (CRESTOR) 10 MG tablet to [id number] - [place], [place] - [address]  Phone: [phone]  Fax:  [phone] since insurance refuse to pay [facility] for this Rx. Please send with the 90 day supply thank you

## 2019-11-28 NOTE — Telephone Encounter (Signed)
[facility]'s medication [facility] to [facility] [place].

## 2020-03-02 DIAGNOSIS — Z86018 Personal history of other benign neoplasm: Secondary | ICD-10-CM | POA: Diagnosis not present

## 2020-03-02 DIAGNOSIS — L439 Lichen planus, unspecified: Secondary | ICD-10-CM | POA: Diagnosis not present

## 2020-03-02 DIAGNOSIS — D485 Neoplasm of uncertain behavior of skin: Secondary | ICD-10-CM | POA: Diagnosis not present

## 2020-03-02 DIAGNOSIS — L578 Other skin changes due to chronic exposure to nonionizing radiation: Secondary | ICD-10-CM | POA: Diagnosis not present

## 2020-03-14 ENCOUNTER — Encounter: Payer: Self-pay | Admitting: Family Medicine

## 2020-03-15 ENCOUNTER — Ambulatory Visit: Payer: Self-pay | Admitting: Family Medicine

## 2020-05-07 DIAGNOSIS — D2361 Other benign neoplasm of skin of right upper limb, including shoulder: Secondary | ICD-10-CM | POA: Diagnosis not present

## 2020-05-16 ENCOUNTER — Ambulatory Visit: Payer: Self-pay | Admitting: Family Medicine

## 2020-05-24 ENCOUNTER — Encounter: Payer: Self-pay | Admitting: Family Medicine

## 2020-05-24 ENCOUNTER — Ambulatory Visit (INDEPENDENT_AMBULATORY_CARE_PROVIDER_SITE_OTHER): Payer: BC Managed Care – PPO | Admitting: Family Medicine

## 2020-05-24 ENCOUNTER — Other Ambulatory Visit: Payer: Self-pay

## 2020-05-24 VITALS — BP 138/77 | HR 66 | Temp 98.2°F | Resp 16 | Ht 68.0 in | Wt 169.0 lb

## 2020-05-24 DIAGNOSIS — Z01419 Encounter for gynecological examination (general) (routine) without abnormal findings: Secondary | ICD-10-CM

## 2020-05-24 DIAGNOSIS — Z0000 Encounter for general adult medical examination without abnormal findings: Secondary | ICD-10-CM | POA: Diagnosis not present

## 2020-05-24 DIAGNOSIS — E785 Hyperlipidemia, unspecified: Secondary | ICD-10-CM

## 2020-05-24 DIAGNOSIS — Z23 Encounter for immunization: Secondary | ICD-10-CM

## 2020-05-24 DIAGNOSIS — Z1329 Encounter for screening for other suspected endocrine disorder: Secondary | ICD-10-CM | POA: Diagnosis not present

## 2020-05-24 DIAGNOSIS — Z1389 Encounter for screening for other disorder: Secondary | ICD-10-CM | POA: Diagnosis not present

## 2020-05-24 NOTE — Progress Notes (Addendum)
I,[name],acting as a scribe for Marcia Durie, MD.,have documented all relevant documentation on the behalf of Marcia Durie, MD,as directed by  Marcia Durie, MD while in the presence of Marcia Durie, MD.   Complete physical exam   Patient: Marcia Farmer   DOB: [birth year]   60 y.o. Female  MRN: [id number] Visit Date: 05/24/2020  Today's healthcare provider: [name], MD   Chief Complaint  Patient presents with   Annual Exam   Subjective    Marcia Farmer is a 60 y.o. female who presents today for a complete physical exam.  She reports consuming a general diet. The patient has a physically strenuous job, but has no regular exercise apart from work.  She generally feels well. She reports sleeping fairly well. She does not have additional problems to discuss today.  She is married and is a mother of 3 children and 3 grandchildren. HPI    Past Medical History:  Diagnosis Date   Dysrhythmia    Hyperlipidemia    Mitral valve insufficiency    Palpitations    Tricuspid valve insufficiency    Past Surgical History:  Procedure Laterality Date   ABDOMINAL HYSTERECTOMY     CHOLECYSTECTOMY     COLONOSCOPY     COLONOSCOPY WITH PROPOFOL N/A 03/03/2016   Procedure: COLONOSCOPY WITH PROPOFOL;  Surgeon: Manya Silvas, MD;  Location: [facility] ENDOSCOPY;  Service: Endoscopy;  Laterality: N/A;   ESOPHAGOGASTRODUODENOSCOPY     HERNIA REPAIR     Social History   Socioeconomic History   Marital status: Married    Spouse name: Not on file   Number of children: Not on file   Years of education: Not on file   Highest education level: Not on file  Occupational History   Not on file  Tobacco Use   Smoking status: Never Smoker   Smokeless tobacco: Never Used  Vaping Use   Vaping Use: Never used  Substance and Sexual Activity   Alcohol use: Yes    Alcohol/week: 10.0 - 14.0 standard drinks    Types: 10 - 14 Glasses of  wine per week    Comment: 7-9oz of wine daily   Drug use: No   Sexual activity: Not on file  Other Topics Concern   Not on file  Social History Narrative   Not on file   Social Determinants of Health   Financial Resource Strain: Not on file  Food Insecurity: Not on file  Transportation Needs: Not on file  Physical Activity: Not on file  Stress: Not on file  Social Connections: Not on file  Intimate Partner Violence: Not on file   Family Status  Relation Name Status   Mother  Alive   Father  Deceased at age 70   Sister  43   Family History  Problem Relation Age of Onset   Heart disease Mother    Diabetes Father    Heart disease Father    Colon cancer Father    Allergies  Allergen Reactions   Nitrofurantoin Rash    Patient Care Team: Jerrol Banana., MD as PCP - General (Family Medicine)   Medications: Outpatient Medications Prior to Visit  Medication Sig   ALPRAZolam (XANAX) 0.25 MG tablet Take 1 tablet (0.25 mg total) by mouth every 8 (eight) hours as needed for anxiety.   rosuvastatin (CRESTOR) 10 MG tablet Take 1 tablet (10 mg total) by mouth daily.   ibuprofen (ADVIL,MOTRIN) 600 MG tablet  Take 600 mg by mouth every 6 (six) hours as needed. (Patient not taking: Reported on 05/24/2020)   No facility-administered medications prior to visit.    Review of Systems  All other systems reviewed and are negative.      Objective    BP 138/77 (BP Location: Right Arm, Patient Position: Sitting, Cuff Size: Normal)    Pulse 66    Temp 98.2 F (36.8 C) (Oral)    Resp 16    Ht 5\' 8"  (1.727 m)    Wt 169 lb (76.7 kg)    SpO2 98%    BMI 25.70 kg/m  BP Readings from Last 3 Encounters:  05/24/20 138/77  11/16/19 117/74  09/21/19 114/75   Wt Readings from Last 3 Encounters:  05/24/20 169 lb (76.7 kg)  11/16/19 164 lb (74.4 kg)  09/21/19 169 lb 6.4 oz (76.8 kg)      Physical Exam Constitutional:      Appearance: She is well-developed.   HENT:     Head: Normocephalic and atraumatic.     Right Ear: External ear normal.     Left Ear: External ear normal.  Eyes:     Conjunctiva/sclera: Conjunctivae normal.     Pupils: Pupils are equal, round, and reactive to light.  Cardiovascular:     Rate and Rhythm: Normal rate and regular rhythm.     Heart sounds: Normal heart sounds.  Pulmonary:     Effort: Pulmonary effort is normal.     Breath sounds: Normal breath sounds.  Abdominal:     General: Bowel sounds are normal.     Palpations: Abdomen is soft.  Musculoskeletal:     Cervical back: Normal range of motion and neck supple.     Right lower leg: No edema.     Left lower leg: No edema.  Skin:    General: Skin is warm and dry.  Neurological:     General: No focal deficit present.     Mental Status: She is alert and oriented to person, place, and time.     Deep Tendon Reflexes: Reflexes are normal and symmetric.  Psychiatric:        Mood and Affect: Mood normal.        Behavior: Behavior normal.        Thought Content: Thought content normal.        Judgment: Judgment normal.       Last depression screening scores PHQ 2/9 Scores 05/24/2020 03/15/2019 09/01/2018  PHQ - 2 Score 0 1 0  PHQ- 9 Score 0 1 -   Last fall risk screening Fall Risk  03/15/2019  Falls in the past year? 0  Number falls in past yr: 0  Injury with Fall? 0  Follow up Falls evaluation completed   Last Audit-C alcohol use screening Alcohol Use Disorder Test (AUDIT) 05/24/2020  1. How often do you have a drink containing alcohol? 3  2. How many drinks containing alcohol do you have on a typical day when you are drinking? 0  3. How often do you have six or more drinks on one occasion? 0  AUDIT-C Score 3  4. How often during the last year have you found that you were not able to stop drinking once you had started? 0  5. How often during the last year have you failed to do what was normally expected from you because of drinking? 0  6. How often  during the last year have you needed a first drink  in the morning to get yourself going after a heavy drinking session? 0  7. How often during the last year have you had a feeling of guilt of remorse after drinking? 0  8. How often during the last year have you been unable to remember what happened the night before because you had been drinking? 0  9. Have you or someone else been injured as a result of your drinking? 0  10. Has a relative or friend or a doctor or another health worker been concerned about your drinking or suggested you cut down? 0  Alcohol Use Disorder Identification Test Final Score (AUDIT) 3  Alcohol Brief Interventions/Follow-up AUDIT Score <7 follow-up not indicated   A score of 3 or more in women, and 4 or more in men indicates increased risk for alcohol abuse, EXCEPT if all of the points are from question 1   No results found for any visits on 05/24/20.  Assessment & Plan    Routine Health Maintenance and Physical Exam  Exercise Activities and Dietary recommendations Goals   None     Immunization History  Administered Date(s) Administered   Influenza Inj Mdck Quad With Preservative 12/23/2017   Influenza, Seasonal, Injecte, Preservative Fre 02/08/2013   Influenza,inj,Quad PF,6+ Mos 03/20/2014   Tdap 12/11/2009, 05/24/2020    Health Maintenance  Topic Date Due   HIV Screening  Never done   COVID-19 Vaccine (1) 06/09/2020 (Originally 01/13/1966)   INFLUENZA VACCINE  10/15/2020 (Originally 10/16/2019)   COLONOSCOPY (Pts 45-72yrs Insurance coverage will need to be confirmed)  07/25/2020   MAMMOGRAM  10/03/2020   TETANUS/TDAP  05/25/2030   Hepatitis C Screening  Completed   HPV VACCINES  Aged Out    Discussed health benefits of physical activity, and encouraged her to engage in regular exercise appropriate for her age and condition.  1. Encounter for annual physical exam Well woman exam per gynecology. - Lipid panel - TSH - CBC  w/Diff/Platelet - Comprehensive Metabolic Panel (CMET)  2. Hyperlipidemia, unspecified hyperlipidemia type  - Lipid panel - TSH - CBC w/Diff/Platelet - Comprehensive Metabolic Panel (CMET)  3. Thyroid disorder screen  - Lipid panel - TSH - CBC w/Diff/Platelet - Comprehensive Metabolic Panel (CMET)  4. Well woman exam  - Ambulatory referral to Gynecology  5. Need for Tdap vaccination  - Tdap vaccine greater than or equal to 7yo IM    Return in about 1 year (around 05/24/2021).     I, Marcia Durie, MD, have reviewed all documentation for this visit. The documentation on 05/27/20 for the exam, diagnosis, procedures, and orders are all accurate and complete.    Shraddha Lebron Cranford Mon, MD  [facility] [phone] (phone) [phone] (fax)  [place]

## 2020-05-25 LAB — COMPREHENSIVE METABOLIC PANEL
ALT: 23 IU/L (ref 0–32)
AST: 23 IU/L (ref 0–40)
Albumin/Globulin Ratio: 2.3 — ABNORMAL HIGH (ref 1.2–2.2)
Albumin: 5 g/dL — ABNORMAL HIGH (ref 3.8–4.9)
Alkaline Phosphatase: 103 IU/L (ref 44–121)
BUN/Creatinine Ratio: 11 (ref 9–23)
BUN: 9 mg/dL (ref 6–24)
Bilirubin Total: 0.5 mg/dL (ref 0.0–1.2)
CO2: 24 mmol/L (ref 20–29)
Calcium: 10.1 mg/dL (ref 8.7–10.2)
Chloride: 102 mmol/L (ref 96–106)
Creatinine, Ser: 0.84 mg/dL (ref 0.57–1.00)
Globulin, Total: 2.2 g/dL (ref 1.5–4.5)
Glucose: 102 mg/dL — ABNORMAL HIGH (ref 65–99)
Potassium: 4.6 mmol/L (ref 3.5–5.2)
Sodium: 142 mmol/L (ref 134–144)
Total Protein: 7.2 g/dL (ref 6.0–8.5)
eGFR: 81 mL/min/{1.73_m2} (ref >59)

## 2020-05-25 LAB — CBC WITH DIFFERENTIAL/PLATELET
Basophils Absolute: 0.1 10*3/uL (ref 0.0–0.2)
Basos: 2 %
EOS (ABSOLUTE): 0.1 10*3/uL (ref 0.0–0.4)
Eos: 2 %
Hematocrit: 41.6 % (ref 34.0–46.6)
Hemoglobin: 14.2 g/dL (ref 11.1–15.9)
Immature Grans (Abs): 0 10*3/uL (ref 0.0–0.1)
Immature Granulocytes: 0 %
Lymphocytes Absolute: 2.1 10*3/uL (ref 0.7–3.1)
Lymphs: 39 %
MCH: 30.8 pg (ref 26.6–33.0)
MCHC: 34.1 g/dL (ref 31.5–35.7)
MCV: 90 fL (ref 79–97)
Monocytes Absolute: 0.5 10*3/uL (ref 0.1–0.9)
Monocytes: 8 %
Neutrophils Absolute: 2.6 10*3/uL (ref 1.4–7.0)
Neutrophils: 49 %
Platelets: 246 10*3/uL (ref 150–450)
RBC: 4.61 x10E6/uL (ref 3.77–5.28)
RDW: 12.1 % (ref 11.7–15.4)
WBC: 5.4 10*3/uL (ref 3.4–10.8)

## 2020-05-25 LAB — LIPID PANEL
Chol/HDL Ratio: 3 ratio (ref 0.0–4.4)
Cholesterol, Total: 177 mg/dL (ref 100–199)
HDL: 60 mg/dL (ref >39)
LDL Chol Calc (NIH): 96 mg/dL (ref 0–99)
Triglycerides: 119 mg/dL (ref 0–149)
VLDL Cholesterol Cal: 21 mg/dL (ref 5–40)

## 2020-05-25 LAB — TSH: TSH: 1.92 u[IU]/mL (ref 0.450–4.500)

## 2020-05-31 ENCOUNTER — Telehealth: Payer: Self-pay

## 2020-05-31 NOTE — Telephone Encounter (Signed)
[facility] referring for well women exam. Called and left voicemail for patient to call back to be scheduled.

## 2020-08-28 DIAGNOSIS — K5730 Diverticulosis of large intestine without perforation or abscess without bleeding: Secondary | ICD-10-CM | POA: Diagnosis not present

## 2020-08-28 DIAGNOSIS — Z86010 Personal history of colonic polyps: Secondary | ICD-10-CM | POA: Diagnosis not present

## 2020-08-28 DIAGNOSIS — Z1211 Encounter for screening for malignant neoplasm of colon: Secondary | ICD-10-CM | POA: Diagnosis not present

## 2020-08-28 DIAGNOSIS — Z881 Allergy status to other antibiotic agents status: Secondary | ICD-10-CM | POA: Diagnosis not present

## 2020-08-28 DIAGNOSIS — Z800 Family history of malignant neoplasm of digestive organs: Secondary | ICD-10-CM | POA: Diagnosis not present

## 2020-11-02 ENCOUNTER — Other Ambulatory Visit: Payer: Self-pay

## 2020-11-02 ENCOUNTER — Ambulatory Visit: Payer: BC Managed Care – PPO | Admitting: Podiatry

## 2020-11-02 DIAGNOSIS — L600 Ingrowing nail: Secondary | ICD-10-CM

## 2020-11-02 DIAGNOSIS — B351 Tinea unguium: Secondary | ICD-10-CM | POA: Diagnosis not present

## 2020-11-02 NOTE — Progress Notes (Signed)
   Subjective: 60 y.o. female presenting today as a new patient for thickening with discoloration and curvature of the right great toenail.  Patient is concerned for possible ingrown toenail.  She does state that over the last few years she has had increased thickening of the toenail and she tries to file it down.  She is concerned for fungal nail as well.  She has tried OTC topical antifungal with no improvement of the nail.  She presents for further treatment and evaluation  Past Medical History:  Diagnosis Date   Dysrhythmia    Hyperlipidemia    Mitral valve insufficiency    Palpitations    Tricuspid valve insufficiency     Objective: Physical Exam General: The patient is alert and oriented x3 in no acute distress.  Dermatology: Hyperkeratotic, discolored, thickened, onychodystrophy noted to the right hallux nail plate as well as the lesser digits of the foot as well. Skin is warm, dry and supple bilateral lower extremities. Negative for open lesions or macerations.  Curvature of the right hallux nail plate noted with sensitivity to the medial and lateral portion of the nail plate and nail fold.  Patient states that currently it is asymptomatic but she does have intermittent tenderness  Vascular: Palpable pedal pulses bilaterally. No edema or erythema noted. Capillary refill within normal limits.  Neurological: Epicritic and protective threshold grossly intact bilaterally.   Musculoskeletal Exam: No pedal deformity noted  Assessment: #1 Onychomycosis of toenails #2 ingrowing toenail medial lateral border of the right great toe  Plan of Care:  #1 Patient was evaluated. #2  Today we discussed different treatment options including oral, topical, and laser antifungal treatment modalities.  We discussed their efficacies and side effects.  Patient opts for oral antifungal treatment modality #3 prior to prescribing the oral Lamisil we will place an order for a liver function panel.  If  it is within normal limits we will prescribe Lamisil 2 and 50 mg #90 daily #4 if this does not improve her symptoms and appearance of the nail plate, we will likely proceed with partial nail matricectomy of the medial and lateral border of the right great toenail #5 return to clinic as needed   Edrick Kins, DPM [facility]  Dr. Edrick Kins, DPM    [address], [place] [address]                Office [phone]  Fax [phone]

## 2020-11-02 NOTE — Patient Instructions (Signed)

## 2020-11-03 LAB — HEPATIC FUNCTION PANEL
ALT: 29 IU/L (ref 0–32)
AST: 25 IU/L (ref 0–40)
Albumin: 4.9 g/dL (ref 3.8–4.9)
Alkaline Phosphatase: 105 IU/L (ref 44–121)
Bilirubin Total: 0.5 mg/dL (ref 0.0–1.2)
Bilirubin, Direct: 0.14 mg/dL (ref 0.00–0.40)
Total Protein: 7.2 g/dL (ref 6.0–8.5)

## 2020-11-05 ENCOUNTER — Ambulatory Visit: Payer: Self-pay | Admitting: Podiatry

## 2020-11-05 DIAGNOSIS — Z1231 Encounter for screening mammogram for malignant neoplasm of breast: Secondary | ICD-10-CM | POA: Diagnosis not present

## 2020-11-05 DIAGNOSIS — Z7689 Persons encountering health services in other specified circumstances: Secondary | ICD-10-CM | POA: Diagnosis not present

## 2020-11-05 DIAGNOSIS — Z01419 Encounter for gynecological examination (general) (routine) without abnormal findings: Secondary | ICD-10-CM | POA: Diagnosis not present

## 2020-11-06 ENCOUNTER — Other Ambulatory Visit: Payer: Self-pay | Admitting: Podiatry

## 2020-11-06 MED ORDER — TERBINAFINE HCL 250 MG PO TABS: 250.0000 mg | ORAL_TABLET | Freq: Every day | ORAL | 0 refills | Status: DC

## 2020-11-06 NOTE — Progress Notes (Signed)
As needed onychomycosis of toenails

## 2020-11-09 ENCOUNTER — Telehealth: Payer: Self-pay

## 2020-11-09 NOTE — Telephone Encounter (Signed)
Copied from [place] [id number]. Topic: General - Other >> Nov 09, 2020  8:09 AM Lennox Solders wrote: Reason for CRM: Pt is calling checking on health assessment form she dropped off on Monday 11-05-2020

## 2020-11-09 NOTE — Telephone Encounter (Signed)
Patient was advised we have received form. Waiting for provider to sign and than it will be ready for pickup.

## 2020-11-13 ENCOUNTER — Other Ambulatory Visit: Payer: Self-pay | Admitting: Family Medicine

## 2020-11-13 DIAGNOSIS — Z1231 Encounter for screening mammogram for malignant neoplasm of breast: Secondary | ICD-10-CM | POA: Diagnosis not present

## 2020-11-13 DIAGNOSIS — E785 Hyperlipidemia, unspecified: Secondary | ICD-10-CM

## 2020-11-13 LAB — HM MAMMOGRAPHY

## 2020-11-15 ENCOUNTER — Encounter: Payer: Self-pay | Admitting: Family Medicine

## 2020-12-12 ENCOUNTER — Other Ambulatory Visit: Payer: Self-pay | Admitting: Family Medicine

## 2020-12-12 DIAGNOSIS — E785 Hyperlipidemia, unspecified: Secondary | ICD-10-CM

## 2020-12-24 ENCOUNTER — Telehealth: Payer: Self-pay

## 2020-12-24 NOTE — Telephone Encounter (Signed)
Copied from [place] [id number]. Topic: General - Other >> Dec 24, 2020  1:49 PM Leward Quan A wrote: Reason for CRM: Patient called in to inform Dr Rosanna Randy that she stopped taking the Statin and that since on or around 11/21/20 and at least 2 weeks later started feeling a lot better. Want to know if Dr Rosanna Randy would like to prescribe another medication for Cholesterol. Also wanted to inform Dr Rosanna Randy that no back pain at all this week with questions and concerns please call Ph# [phone]

## 2020-12-26 ENCOUNTER — Other Ambulatory Visit: Payer: Self-pay | Admitting: Family Medicine

## 2020-12-26 DIAGNOSIS — E785 Hyperlipidemia, unspecified: Secondary | ICD-10-CM

## 2020-12-27 MED ORDER — EZETIMIBE 10 MG PO TABS: 10.0000 mg | ORAL_TABLET | Freq: Every day | ORAL | 1 refills | Status: DC

## 2020-12-27 NOTE — Telephone Encounter (Signed)
Pt advised.  Zetia sent to [facility].  Thanks,   -Mickel Baas

## 2021-05-28 ENCOUNTER — Encounter: Payer: Self-pay | Admitting: Family Medicine

## 2021-06-26 ENCOUNTER — Telehealth: Payer: Self-pay

## 2021-06-26 NOTE — Telephone Encounter (Signed)
Copied from [place] [id number]. Topic: Appointment Scheduling - Scheduling Inquiry for Clinic >> Jun 25, 2021 10:27 AM Rayann Heman wrote: Reason for CRM: Pt would like to know if she can get physical in by the end of this month due to insurance changing. Please advise

## 2021-06-26 NOTE — Telephone Encounter (Signed)
Is there anywhere this pt can be worked in for her cpe?

## 2021-06-26 NOTE — Telephone Encounter (Signed)
Not this month, unless she is willing to see another provider. We are at 100% everyday for the rest of the month. ?

## 2021-06-27 DIAGNOSIS — L821 Other seborrheic keratosis: Secondary | ICD-10-CM | POA: Diagnosis not present

## 2021-06-27 NOTE — Telephone Encounter (Signed)
Already spoke to patient and scheduled her with Mikey Kirschner.

## 2021-07-09 ENCOUNTER — Encounter: Payer: BC Managed Care – PPO | Admitting: Family Medicine

## 2021-07-09 NOTE — Progress Notes (Signed)
? ? ?I,Sha'taria Tyson,acting as a Education administrator for [employer], PA-C.,have documented all relevant documentation on the behalf of Mikey Kirschner, PA-C,as directed by  Mikey Kirschner, PA-C while in the presence of Mikey Kirschner, PA-C. ? ? ?Complete physical exam ? ? ?Patient: Marcia Farmer   DOB: 1960-05-27   61 y.o. Female  MRN: [id number] ?Visit Date: 07/10/2021 ? ?Today's healthcare provider: Mikey Kirschner, PA-C  ? ?Cc. cpe ? ?Subjective  ?  ?Marcia Farmer is a 61 y.o. female who presents today for a complete physical exam.  ?She reports consuming a general diet. The patient does not participate in regular exercise at present. She generally feels well. She reports sleeping well. She does have additional problems to discuss today.  ?HPI  ?Left foot lateral burning pain comes and goes with tenderness to touch x a few months. Denies injury, swelling, low back pain.  ? ? ?Past Medical History:  ?Diagnosis Date  ? Dysrhythmia   ? Hyperlipidemia   ? Mitral valve insufficiency   ? Palpitations   ? Tricuspid valve insufficiency   ? ?Past Surgical History:  ?Procedure Laterality Date  ? ABDOMINAL HYSTERECTOMY    ? CHOLECYSTECTOMY    ? COLONOSCOPY    ? COLONOSCOPY WITH PROPOFOL N/A 03/03/2016  ? Procedure: COLONOSCOPY WITH PROPOFOL;  Surgeon: Manya Silvas, MD;  Location: [facility] ENDOSCOPY;  Service: Endoscopy;  Laterality: N/A;  ? ESOPHAGOGASTRODUODENOSCOPY    ? HERNIA REPAIR    ? ?Social History  ? ?Socioeconomic History  ? Marital status: Married  ?  Spouse name: Not on file  ? Number of children: Not on file  ? Years of education: Not on file  ? Highest education level: Not on file  ?Occupational History  ? Not on file  ?Tobacco Use  ? Smoking status: Never  ? Smokeless tobacco: Never  ?Vaping Use  ? Vaping Use: Never used  ?Substance and Sexual Activity  ? Alcohol use: Yes  ?  Alcohol/week: 10.0 - 14.0 standard drinks  ?  Types: 10 - 14 Glasses of wine per week  ?  Comment: 7-9oz of wine daily  ? Drug use: No   ? Sexual activity: Not on file  ?Other Topics Concern  ? Not on file  ?Social History Narrative  ? Not on file  ? ?Social Determinants of Health  ? ?Financial Resource Strain: Not on file  ?Food Insecurity: Not on file  ?Transportation Needs: Not on file  ?Physical Activity: Not on file  ?Stress: Not on file  ?Social Connections: Not on file  ?Intimate Partner Violence: Not on file  ? ?Family Status  ?Relation Name Status  ? Mother  Alive  ? Father  Deceased at age 41  ? Sister  Alive  ? ?Family History  ?Problem Relation Age of Onset  ? Heart disease Mother   ? Diabetes Father   ? Heart disease Father   ? Colon cancer Father   ? ?Allergies  ?Allergen Reactions  ? Nitrofurantoin Rash  ?  ?Patient Care Team: ?Jerrol Banana., MD as PCP - General (Family Medicine)  ? ?Medications: ?Outpatient Medications Prior to Visit  ?Medication Sig  ? ALPRAZolam (XANAX) 0.25 MG tablet Take 1 tablet (0.25 mg total) by mouth every 8 (eight) hours as needed for anxiety.  ? ibuprofen (ADVIL,MOTRIN) 600 MG tablet Take 600 mg by mouth every 6 (six) hours as needed.  ? rosuvastatin (CRESTOR) 10 MG tablet TAKE 1 TABLET BY MOUTH EVERY DAY (Patient taking differently: Take 1  tab by mouth every other day)  ? [DISCONTINUED] ezetimibe (ZETIA) 10 MG tablet Take 1 tablet (10 mg total) by mouth daily. (Patient not taking: Reported on 07/10/2021)  ? [DISCONTINUED] terbinafine (LAMISIL) 250 MG tablet Take 1 tablet (250 mg total) by mouth daily. (Patient not taking: Reported on 07/10/2021)  ? ?No facility-administered medications prior to visit.  ? ? ?Review of Systems  ?Constitutional:  Negative for fatigue and fever.  ?Respiratory:  Negative for cough and shortness of breath.   ?Cardiovascular:  Negative for chest pain and leg swelling.  ?Gastrointestinal:  Negative for abdominal pain.  ?Musculoskeletal:  Positive for arthralgias.  ?Neurological:  Negative for dizziness and headaches.  ? ? Objective  ? ?  ?Blood pressure 128/70, pulse 72,  temperature 98.2 ?F (36.8 ?C), temperature source Oral, height '5\' 8"'$  (1.727 m), weight 164 lb 14.4 oz (74.8 kg), SpO2 100 %.  ? ? ?Physical Exam ?Constitutional:   ?   General: She is awake.  ?   Appearance: She is well-developed. She is not ill-appearing.  ?HENT:  ?   Head: Normocephalic.  ?   Right Ear: Tympanic membrane normal.  ?   Left Ear: Tympanic membrane normal.  ?   Nose: Nose normal. No congestion or rhinorrhea.  ?   Mouth/Throat:  ?   Pharynx: No oropharyngeal exudate or posterior oropharyngeal erythema.  ?Eyes:  ?   Conjunctiva/sclera: Conjunctivae normal.  ?   Pupils: Pupils are equal, round, and reactive to light.  ?Neck:  ?   Thyroid: No thyroid mass or thyromegaly.  ?Cardiovascular:  ?   Rate and Rhythm: Normal rate and regular rhythm.  ?   Heart sounds: Normal heart sounds.  ?Pulmonary:  ?   Effort: Pulmonary effort is normal.  ?   Breath sounds: Normal breath sounds.  ?Abdominal:  ?   Palpations: Abdomen is soft.  ?   Tenderness: There is no abdominal tenderness.  ?Musculoskeletal:  ?   Right lower leg: No swelling. No edema.  ?   Left lower leg: No swelling. No edema.  ?   Comments: Tenderness lateral L foot  ?Lymphadenopathy:  ?   Cervical: No cervical adenopathy.  ?Skin: ?   General: Skin is warm.  ?Neurological:  ?   Mental Status: She is alert and oriented to person, place, and time.  ?Psychiatric:     ?   Attention and Perception: Attention normal.     ?   Mood and Affect: Mood normal.     ?   Speech: Speech normal.     ?   Behavior: Behavior normal. Behavior is cooperative.  ?  ? ?Last depression screening scores ? ?  07/10/2021  ? 10:05 AM 05/24/2020  ? 10:37 AM 03/15/2019  ? 10:49 AM  ?PHQ 2/9 Scores  ?PHQ - 2 Score 2 0 1  ?PHQ- 9 Score 4 0 1  ? ?Last fall risk screening ? ?  07/10/2021  ? 10:04 AM  ?Fall Risk   ?Falls in the past year? 0  ?Number falls in past yr: 0  ?Injury with Fall? 0  ?Risk for fall due to : No Fall Risks  ? ?Last Audit-C alcohol use screening ? ?  07/10/2021  ? 10:04 AM   ?Alcohol Use Disorder Test (AUDIT)  ?1. How often do you have a drink containing alcohol? 3  ?2. How many drinks containing alcohol do you have on a typical day when you are drinking? 0  ?3. How often do  you have six or more drinks on one occasion? 0  ?AUDIT-C Score 3  ? ?A score of 3 or more in women, and 4 or more in men indicates increased risk for alcohol abuse, EXCEPT if all of the points are from question 1  ? ?No results found for any visits on 07/10/21. ? Assessment & Plan  ?  ?Routine Health Maintenance and Physical Exam ? ?Exercise Activities and Dietary recommendations ?--balanced diet high in fiber and protein, low in sugars, carbs, fats. ?--physical activity/exercise 30 minutes 3-5 times a week  ? ? ? ?Immunization History  ?Administered Date(s) Administered  ? Influenza Inj Mdck Quad With Preservative 12/23/2017  ? Influenza, Seasonal, Injecte, Preservative Fre 02/08/2013  ? Influenza,inj,Quad PF,6+ Mos 03/20/2014  ? Tdap 12/11/2009, 05/24/2020  ? ? ?Health Maintenance  ?Topic Date Due  ? HIV Screening  Never done  ? MAMMOGRAM  10/03/2020  ? COVID-19 Vaccine (1) 07/26/2021 (Originally 07/14/1961)  ? Zoster Vaccines- Shingrix (1 of 2) 10/09/2021 (Originally 01/12/2013)  ? INFLUENZA VACCINE  10/15/2021  ? COLONOSCOPY (Pts 45-53yr Insurance coverage will need to be confirmed)  08/29/2023  ? TETANUS/TDAP  05/25/2030  ? Hepatitis C Screening  Completed  ? HPV VACCINES  Aged Out  ? ? ?Discussed health benefits of physical activity, and encouraged her to engage in regular exercise appropriate for her age and condition. ? ?Problem List Items Addressed This Visit   ? ?  ? Other  ? Hyperlipidemia  ?  Managing with crestor 20 mg every other day to help with muscle pains ?Reports trying other cholesterol meds with worse symptoms ?D/c zetia for similar reasons ?Fasting today will check lipid panel ? ?  ?  ? Relevant Orders  ? Lipid Profile  ? Comprehensive Metabolic Panel (CMET)  ? Numbness and tingling of foot  ?   Burning pain lateral L foot ?Will check vit b12 ?May be tenonitis -- advised warm compresses, massage. If no improvement and normal labs can send to podiatry or PT ? ?  ?  ? Relevant Orders  ? Vitam

## 2021-07-10 ENCOUNTER — Ambulatory Visit (INDEPENDENT_AMBULATORY_CARE_PROVIDER_SITE_OTHER): Payer: BC Managed Care – PPO | Admitting: Physician Assistant

## 2021-07-10 ENCOUNTER — Encounter: Payer: Self-pay | Admitting: Physician Assistant

## 2021-07-10 VITALS — BP 128/70 | HR 72 | Temp 98.2°F | Ht 68.0 in | Wt 164.9 lb

## 2021-07-10 DIAGNOSIS — R739 Hyperglycemia, unspecified: Secondary | ICD-10-CM

## 2021-07-10 DIAGNOSIS — R200 Anesthesia of skin: Secondary | ICD-10-CM | POA: Diagnosis not present

## 2021-07-10 DIAGNOSIS — Z0000 Encounter for general adult medical examination without abnormal findings: Secondary | ICD-10-CM | POA: Diagnosis not present

## 2021-07-10 DIAGNOSIS — E785 Hyperlipidemia, unspecified: Secondary | ICD-10-CM | POA: Diagnosis not present

## 2021-07-10 DIAGNOSIS — R202 Paresthesia of skin: Secondary | ICD-10-CM

## 2021-07-10 NOTE — Assessment & Plan Note (Signed)
Burning pain lateral L foot ?Will check vit b12 ?May be tenonitis -- advised warm compresses, massage. If no improvement and normal labs can send to podiatry or PT ?

## 2021-07-10 NOTE — Assessment & Plan Note (Signed)
Managing with crestor 20 mg every other day to help with muscle pains ?Reports trying other cholesterol meds with worse symptoms ?D/c zetia for similar reasons ?Fasting today will check lipid panel ?

## 2021-07-11 LAB — COMPREHENSIVE METABOLIC PANEL
ALT: 19 IU/L (ref 0–32)
AST: 24 IU/L (ref 0–40)
Albumin/Globulin Ratio: 2.1 (ref 1.2–2.2)
Albumin: 4.9 g/dL (ref 3.8–4.9)
Alkaline Phosphatase: 94 IU/L (ref 44–121)
BUN/Creatinine Ratio: 13 (ref 9–23)
BUN: 11 mg/dL (ref 6–24)
Bilirubin Total: 0.6 mg/dL (ref 0.0–1.2)
CO2: 24 mmol/L (ref 20–29)
Calcium: 9.9 mg/dL (ref 8.7–10.2)
Chloride: 104 mmol/L (ref 96–106)
Creatinine, Ser: 0.83 mg/dL (ref 0.57–1.00)
Globulin, Total: 2.3 g/dL (ref 1.5–4.5)
Glucose: 95 mg/dL (ref 70–99)
Potassium: 4.8 mmol/L (ref 3.5–5.2)
Sodium: 143 mmol/L (ref 134–144)
Total Protein: 7.2 g/dL (ref 6.0–8.5)
eGFR: 82 mL/min/{1.73_m2} (ref >59)

## 2021-07-11 LAB — CBC WITH DIFFERENTIAL/PLATELET
Basophils Absolute: 0.1 10*3/uL (ref 0.0–0.2)
Basos: 1 %
EOS (ABSOLUTE): 0.1 10*3/uL (ref 0.0–0.4)
Eos: 3 %
Hematocrit: 42.1 % (ref 34.0–46.6)
Hemoglobin: 14.2 g/dL (ref 11.1–15.9)
Immature Grans (Abs): 0 10*3/uL (ref 0.0–0.1)
Immature Granulocytes: 0 %
Lymphocytes Absolute: 2.1 10*3/uL (ref 0.7–3.1)
Lymphs: 38 %
MCH: 30.5 pg (ref 26.6–33.0)
MCHC: 33.7 g/dL (ref 31.5–35.7)
MCV: 90 fL (ref 79–97)
Monocytes Absolute: 0.4 10*3/uL (ref 0.1–0.9)
Monocytes: 8 %
Neutrophils Absolute: 2.7 10*3/uL (ref 1.4–7.0)
Neutrophils: 50 %
Platelets: 248 10*3/uL (ref 150–450)
RBC: 4.66 x10E6/uL (ref 3.77–5.28)
RDW: 12.4 % (ref 11.7–15.4)
WBC: 5.4 10*3/uL (ref 3.4–10.8)

## 2021-07-11 LAB — LIPID PANEL
Chol/HDL Ratio: 3.1 ratio (ref 0.0–4.4)
Cholesterol, Total: 175 mg/dL (ref 100–199)
HDL: 57 mg/dL (ref >39)
LDL Chol Calc (NIH): 101 mg/dL — ABNORMAL HIGH (ref 0–99)
Triglycerides: 96 mg/dL (ref 0–149)
VLDL Cholesterol Cal: 17 mg/dL (ref 5–40)

## 2021-07-11 LAB — TSH+FREE T4
Free T4: 1.07 ng/dL (ref 0.82–1.77)
TSH: 1.63 u[IU]/mL (ref 0.450–4.500)

## 2021-07-11 LAB — HEMOGLOBIN A1C
Est. average glucose Bld gHb Est-mCnc: 114 mg/dL
Hgb A1c MFr Bld: 5.6 % (ref 4.8–5.6)

## 2021-07-11 LAB — VITAMIN B12: Vitamin B-12: 462 pg/mL (ref 232–1245)

## 2021-12-03 ENCOUNTER — Encounter: Payer: BC Managed Care – PPO | Admitting: Family Medicine

## 2022-01-09 ENCOUNTER — Ambulatory Visit: Payer: BLUE CROSS/BLUE SHIELD | Admitting: Plastic Surgery

## 2022-01-09 ENCOUNTER — Encounter: Payer: Self-pay | Admitting: Plastic Surgery

## 2022-01-09 VITALS — BP 136/82 | HR 80 | Ht 68.0 in | Wt 167.5 lb

## 2022-01-09 DIAGNOSIS — T8544XA Capsular contracture of breast implant, initial encounter: Secondary | ICD-10-CM | POA: Diagnosis not present

## 2022-01-09 DIAGNOSIS — Z9882 Breast implant status: Secondary | ICD-10-CM

## 2022-01-09 NOTE — Progress Notes (Signed)
   Referring Provider Jerrol Banana., MD No address on file   CC:  Chief Complaint  Patient presents with   Advice Only      Marcia Farmer is an 61 y.o. female.  HPI: Who presents for discussion of removal of her breast implants she had gel implants placed many years ago and had them replaced in 1995 with the current saline implants.  She is concerned with the long-term health risks of having breast implants and now wishes to have them removed.  Allergies  Allergen Reactions   Nitrofurantoin Rash    Outpatient Encounter Medications as of 01/09/2022  Medication Sig   ALPRAZolam (XANAX) 0.25 MG tablet Take 1 tablet (0.25 mg total) by mouth every 8 (eight) hours as needed for anxiety.   ibuprofen (ADVIL,MOTRIN) 600 MG tablet Take 600 mg by mouth every 6 (six) hours as needed.   rosuvastatin (CRESTOR) 10 MG tablet TAKE 1 TABLET BY MOUTH EVERY DAY (Patient taking differently: Take 1 tab by mouth every other day)   No facility-administered encounter medications on file as of 01/09/2022.     Past Medical History:  Diagnosis Date   Dysrhythmia    Hyperlipidemia    Mitral valve insufficiency    Palpitations    Tricuspid valve insufficiency     Past Surgical History:  Procedure Laterality Date   ABDOMINAL HYSTERECTOMY     CHOLECYSTECTOMY     COLONOSCOPY     COLONOSCOPY WITH PROPOFOL N/A 03/03/2016   Procedure: COLONOSCOPY WITH PROPOFOL;  Surgeon: Manya Silvas, MD;  Location: [facility] ENDOSCOPY;  Service: Endoscopy;  Laterality: N/A;   ESOPHAGOGASTRODUODENOSCOPY     HERNIA REPAIR      Family History  Problem Relation Age of Onset   Heart disease Mother    Diabetes Father    Heart disease Father    Colon cancer Father     Social History   Social History Narrative   Not on file     Review of Systems General: Denies fevers, chills, weight loss CV: Denies chest pain, shortness of breath, palpitations Breast: Bilateral breast implants  Physical  Exam    01/09/2022    1:52 PM 07/10/2021   10:25 AM 07/10/2021   10:00 AM  Vitals with BMI  Height '5\' 8"'$   '5\' 8"'$   Weight 167 lbs 8 oz  164 lbs 14 oz  BMI 41.93  79.02  Systolic 409 735 329  Diastolic 82 70 83  Pulse 80 72 82    General:  No acute distress,  Alert and oriented, Non-Toxic, Normal speech and affect Breasts: Bilateral breast implants are intact.  She has no palpable masses no nipple retraction and no nipple discharge.  Grade 2 capsular contracture  Assessment/Plan Bilateral breast implants: We will schedule for removal of the implants.  We will plan to remove them through an inframammary fold incision with removal of the capsules.  We discussed the risks of surgery including bleeding infection and seroma formation.  We discussed the use of drains and compression postoperatively.  We discussed the importance of light activity for 2 weeks postoperatively.  We will submit for consideration of removal of implants by the insurance company.  Camillia Herter 01/09/2022, 4:12 PM

## 2022-01-24 ENCOUNTER — Encounter: Payer: Self-pay | Admitting: *Deleted

## 2022-01-30 ENCOUNTER — Telehealth: Payer: Self-pay | Admitting: *Deleted

## 2022-01-30 NOTE — Telephone Encounter (Signed)
Spoke to patient about [facility] facilities being out of network and her plan not having [employer] benefits. She was already aware and has cosmetic quote. Will reach out if/when ready to schedule.

## 2022-01-30 NOTE — Telephone Encounter (Signed)
LVM to notify patient that her

## 2022-07-15 ENCOUNTER — Encounter: Payer: BC Managed Care – PPO | Admitting: Family Medicine
